# Patient Record
Sex: Male | Born: 1985 | Race: White | Hispanic: No | Marital: Married | Smoking: Never smoker
Health system: Southern US, Community
[De-identification: ages and names within clinical notes are randomized; demographics above are authoritative.]

## PROBLEM LIST (undated history)

## (undated) DIAGNOSIS — K589 Irritable bowel syndrome without diarrhea: Secondary | ICD-10-CM

## (undated) DIAGNOSIS — Z8619 Personal history of other infectious and parasitic diseases: Secondary | ICD-10-CM

## (undated) DIAGNOSIS — L02419 Cutaneous abscess of limb, unspecified: Secondary | ICD-10-CM

## (undated) DIAGNOSIS — S86011A Strain of right Achilles tendon, initial encounter: Secondary | ICD-10-CM

## (undated) DIAGNOSIS — M009 Pyogenic arthritis, unspecified: Secondary | ICD-10-CM

## (undated) DIAGNOSIS — J342 Deviated nasal septum: Secondary | ICD-10-CM

## (undated) DIAGNOSIS — K219 Gastro-esophageal reflux disease without esophagitis: Secondary | ICD-10-CM

## (undated) HISTORY — DX: Cutaneous abscess of limb, unspecified: L02.419

## (undated) HISTORY — PX: ACHILLES TENDON REPAIR: SUR1153

## (undated) HISTORY — DX: Personal history of other infectious and parasitic diseases: Z86.19

## (undated) HISTORY — DX: Pyogenic arthritis, unspecified: M00.9

## (undated) HISTORY — DX: Irritable bowel syndrome, unspecified: K58.9

## (undated) HISTORY — PX: WISDOM TOOTH EXTRACTION: SHX21

## (undated) HISTORY — DX: Strain of right Achilles tendon, initial encounter: S86.011A

## (undated) HISTORY — PX: TONSILLECTOMY: SUR1361

## (undated) HISTORY — DX: Gastro-esophageal reflux disease without esophagitis: K21.9

## (undated) HISTORY — DX: Deviated nasal septum: J34.2

---
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
## 2017-08-13 ENCOUNTER — Other Ambulatory Visit: Payer: Self-pay | Admitting: Family Medicine

## 2017-08-13 DIAGNOSIS — R599 Enlarged lymph nodes, unspecified: Secondary | ICD-10-CM

## 2017-08-13 DIAGNOSIS — R591 Generalized enlarged lymph nodes: Secondary | ICD-10-CM

## 2017-08-19 ENCOUNTER — Other Ambulatory Visit: Payer: Self-pay

## 2017-08-22 ENCOUNTER — Ambulatory Visit
Admission: RE | Admit: 2017-08-22 | Discharge: 2017-08-22 | Disposition: A | Discharge status: Home / Self Care | Payer: BLUE CROSS/BLUE SHIELD | Source: Ambulatory Visit | Attending: Family Medicine | Admitting: Family Medicine

## 2017-08-22 DIAGNOSIS — R591 Generalized enlarged lymph nodes: Secondary | ICD-10-CM

## 2017-08-22 DIAGNOSIS — R599 Enlarged lymph nodes, unspecified: Secondary | ICD-10-CM

## 2017-08-22 MED ORDER — IOPAMIDOL (ISOVUE-300) INJECTION 61%
75.0000 mL | Freq: Once | INTRAVENOUS | Status: AC | PRN
  Administered 2017-08-22: 75 mL via INTRAVENOUS

## 2018-04-28 IMAGING — CT CT NECK W/ CM
3 of 5 series · 9 of 20 positions shown, 10 images · IV contrast (75CC ISOVUE 300)
Comparison: Chest CTA 02/09/2017.  Thyroid ultrasound 12/09/2014.

CLINICAL DATA: 31-year-old male with bilateral intermittent
lymphadenopathy. Diagnosed with abstain bar virus in [REDACTED].
Thyroid region pain for 18 months.

EXAM:
CT NECK WITH CONTRAST
TECHNIQUE: Multidetector CT imaging of the neck was performed using the
standard protocol following the bolus administration of intravenous
contrast.
CONTRAST:  75mL QRMZ3I-1MM IOPAMIDOL (QRMZ3I-1MM) INJECTION 61%

[Series 3: axial neck · axial · 0.49mm/px · z∈[-232,-145]mm · 2 of 105 slices shown]
[im 35/105  bone]
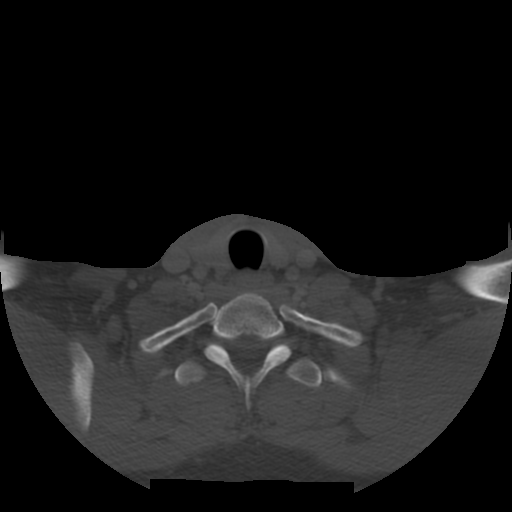
[im 70/105  bone]
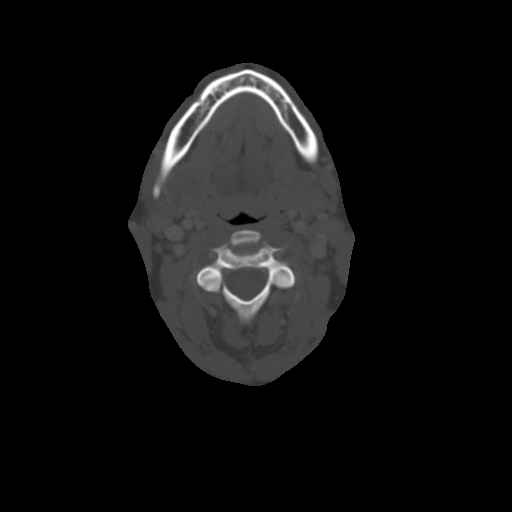

[Series 200: cor · coronal · 0.52mm/px · 3 of 97 slices shown]
[im 24/97  bone]
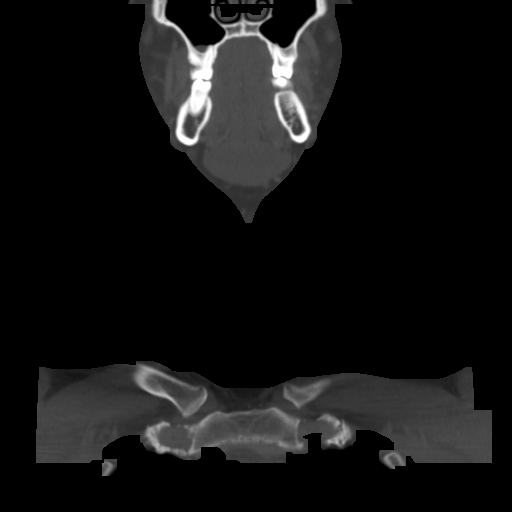
[im 40/97  bone]
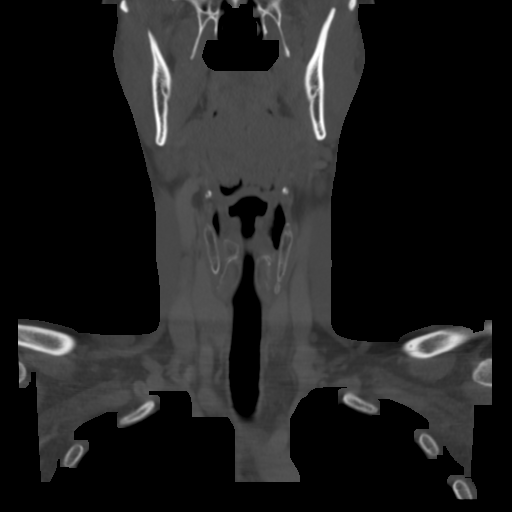
[im 57/97  bone]
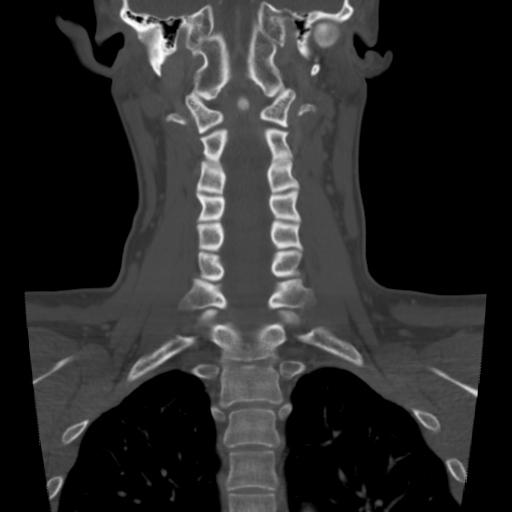

[Series 202: axial · axial · 0.52mm/px · z∈[-308,-153]mm · 4 of 135 slices shown, 5 images]
[im 27/135  soft-tissue]
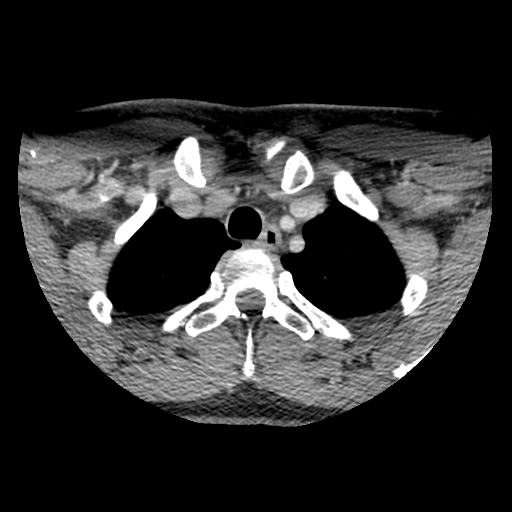
[im 27/135  bone]
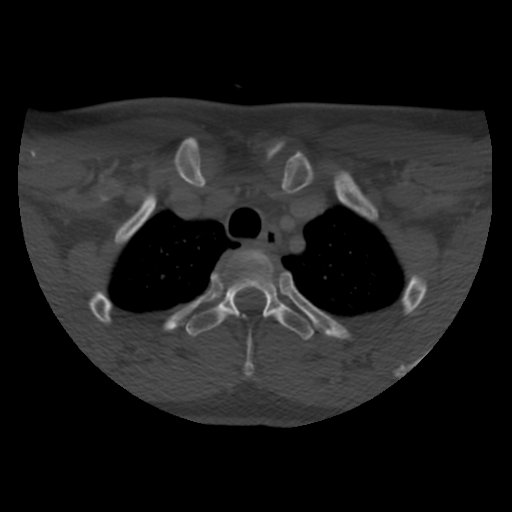
[im 54/135  bone]
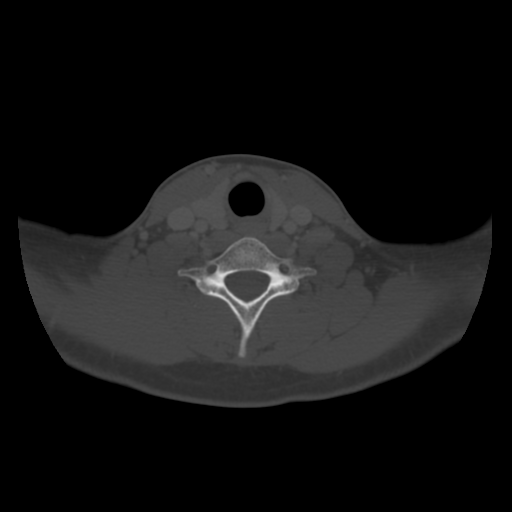
[im 81/135  bone]
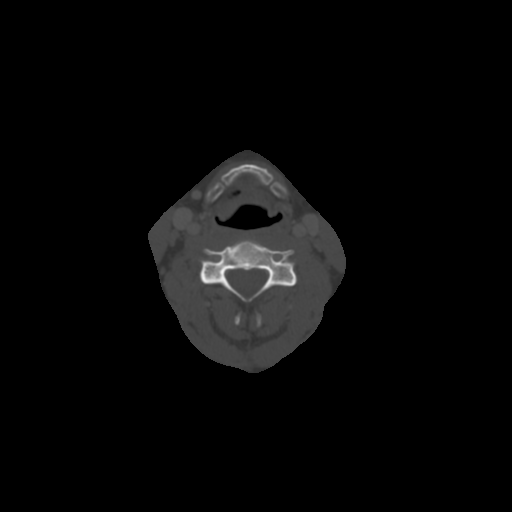
[im 108/135  bone]
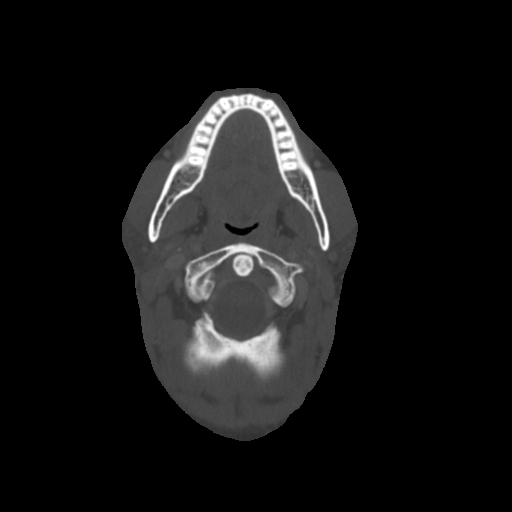

[9 of 20 positions shown; findings below may reference images not displayed]

FINDINGS: Pharynx and larynx: Laryngeal soft tissue contours are within normal
limits. There is mild hypertrophy of the lingual tonsil. The
palatine tonsils and adenoids may be surgically absent. Other
pharyngeal soft tissue contours are within normal limits. Negative
parapharyngeal and retropharyngeal spaces.

Salivary glands: Negative sublingual space. Bilateral submandibular
glands appear symmetric and within normal limits. Bilateral parotid
glands appear normal.

Thyroid: The thyroid appears stable to that visible on the prior
chest CTA and normal.

Lymph nodes: There are maximal level 2A lymph nodes measuring up to
10 mm 10-8700 mm short axis bilaterally. The left level 2 nodes are
slightly larger than the right. But no cystic or necrotic nodes are
identified. The remaining bilateral cervical lymph node stations are
within normal limits.

Vascular: Major vascular structures in the neck and at the skullbase
are patent and appear normal.

Limited intracranial: Negative.

Visualized orbits: Negative.

Mastoids and visualized paranasal sinuses: Clear.

Skeleton: No osseous abnormality identified.

Upper chest: The visualized superior mediastinum is normal. Negative
visible bilateral lung parenchyma.
IMPRESSION: 1. Cervical lymph nodes overall are within normal limits;
Maximal bilateral level 2 nodes demonstrate no heterogeneity or
suspicious features and are probably physiologic. Clinical
surveillance recommended, with repeat neck CT if enlargement is
detected.
2. Mild lingual tonsil hypertrophy is also likely physiologic as it
appears the palatine tonsils and adenoids are surgically absent.
3. Otherwise normal neck CT.

## 2018-07-24 ENCOUNTER — Encounter (INDEPENDENT_AMBULATORY_CARE_PROVIDER_SITE_OTHER): Payer: Self-pay

## 2018-09-18 ENCOUNTER — Ambulatory Visit: Payer: BLUE CROSS/BLUE SHIELD | Admitting: Family Medicine

## 2018-09-18 ENCOUNTER — Encounter: Payer: Self-pay | Admitting: Family Medicine

## 2018-09-18 DIAGNOSIS — Z0000 Encounter for general adult medical examination without abnormal findings: Secondary | ICD-10-CM | POA: Diagnosis not present

## 2018-09-18 NOTE — Patient Instructions (Signed)
-Great to meet you! -Drop off lab tests at your convenience or upload through [facility].  -F/u as needed  Adult Wellness Guidelines   Adult Health - for Ages 80 and Over Preventive care is very important for adults. By making some good basic health choices, women and men can boost their own health and well-being. Some of these positive choices include:   Eat a healthy diet  Get regular exercise  Don't use tobacco products  Limit alcohol use  Strive for a healthy weight  Adult Recommendations Screenings Physical Exam Every year, or as directed by your doctor. Body Mass Index (BMI) Every year. Blood Pressure (BP) At least every two years. Colon Cancer Screening Beginning at age 43 - colonoscopy every 10 years, or flexible sigmoidoscopy every five years or fecal blood test annually. Diabetes Screening Those with high blood pressure or high cholesterol should be screened. Others, especially those who are overweight or have a close family history of diabetes, should consider being screened every three years. Vision Screening Every year.  Immunizations Tetanus, Diphtheria, Pertussis (Td/ Tdap) Get Tdap vaccine once, then a Td booster every 10 years. Influenza (Flu) Every year. Herpes Zoster (Shingles) One dose given at age 63 and over. Varicella (Chicken Pox) Two doses if no evidence of immunity. Pneumococcal (Pneumonia) One or two doses for adults age 45 and older, or one or two doses depending on indication. Measles, Mumps, Rubella (MMR) One or two doses for adults ages 2-55 if no evidence of immunity. Human Papillomavirus (HPV) Three doses for women ages 19-26 if not already given. Three doses for men ages 19-21 if not already given.* Hepatitis A Two or three doses for adults age 34 and over.** Hepatitis B Three doses for ages 78 and over.** * Recommendations may vary. Discuss the start and frequency of screenings with your doctor, especially if you are at increased risk. **  For select populations. Discuss with your doctor if this vaccine is right for you.  Women's Health Women have their own unique health care needs. To stay well, they should make regular screenings a priority. Women should discuss the recommendations listed on the chart with their doctors. Women's Recommendations Mammogram Every year for women beginning at age 7.* Cholesterol Starting age and frequency of screenings are based on your individual risk factors. Talk with your doctor about what is best for you. Pap Test Women ages 21-65: Pap test every three years. Another option for ages 17-65: Pap test and HPV test every five years. Women who have had a hysterectomy or are over age 29 may not need a Pap test.* Osteoporosis Screening Beginning at age 41, or at age 2 if risk factors are present.* Aspirin Use At ages 66-79, talk with your doctor about the benefits and risks of aspirin use. Pelvic Exam Every year for ages 10 and over. Folic Acid Women planning/capable of pregnancy should take a daily supplement containing .4-.8 mg of folic acid for prevention of neural tube defects.  * Recommendations may vary. Discuss screening options with your doctor, especially if you are at increased risk. Sources: [place] Department of [employer] and Financial controller and the Centers for Disease Control and Prevention, U.S. Preventive Services Task Force   Men's Health Recommendations Men are encouraged to get care as needed and make smart choices. That includes following a healthy lifestyle and getting recommended preventive care services.  Recommended preventative care services are as follows:  Cholesterol Ages 20-35 should be tested if at high risk.  Men age 41 and over should be tested. Prostate Cancer Screening Ages 71 and over, discuss the benefits and risks of screening with your doctor.* Abdominal Aortic Aneurysm Once between ages 86 and 34 if you have ever smoked.

## 2018-09-18 NOTE — Assessment & Plan Note (Signed)
-  Well adult -immunizations: Up to date -Screenings:  None indicated at this time, had labs completed for insurance policy recently -Anticipatory guidance/Risk factor reduction:  Per AVS

## 2018-09-18 NOTE — Progress Notes (Signed)
Willie Maynard - 33 y.o. male MRN [id number]  Date of birth: 08/26/1986  Subjective Chief Complaint  Patient presents with  . Establish Care    denies changes in heatlth or concerns    HPI Willie Maynard is a 33 y.o. male here today to establish care and for annual exam.  He is also followed by integrative medicine specialist and has been treated for chronic EBV with acyclovir.  He is taking several supplements in addition to intermittent acyclovir.  He reports that symptoms of fatigue and blisters in throat have improved significantly since starting treatment.  He reports having labs completed recently for insurance policy and he will bring these in.  He has no new concerns today.  He works as a Education officer, community and follows a fairly healthy lifestyle.  He is a non-smoker.   Review of Systems  Constitutional: Negative for chills, fever, malaise/fatigue and weight loss.  HENT: Negative for congestion, ear pain and sore throat.   Eyes: Negative for blurred vision, double vision and pain.  Respiratory: Negative for cough and shortness of breath.   Cardiovascular: Negative for chest pain and palpitations.  Gastrointestinal: Negative for abdominal pain, blood in stool, constipation, heartburn and nausea.  Genitourinary: Negative for dysuria and urgency.  Musculoskeletal: Negative for joint pain and myalgias.  Neurological: Negative for dizziness and headaches.  Endo/Heme/Allergies: Does not bruise/bleed easily.  Psychiatric/Behavioral: Negative for depression. The patient is not nervous/anxious and does not have insomnia.     Allergies  Allergen Reactions  . Sulfa Antibiotics Hives, Other (See Comments) and Rash    History reviewed. No pertinent past medical history.  History reviewed. No pertinent surgical history.  Social History   Socioeconomic History  . Marital status: Married    Spouse name: Not on file  . Number of children: Not on file  . Years of education: Not on file    . Highest education level: Not on file  Occupational History  . Not on file  Social Needs  . Financial resource strain: Not on file  . Food insecurity:    Worry: Not on file    Inability: Not on file  . Transportation needs:    Medical: Not on file    Non-medical: Not on file  Tobacco Use  . Smoking status: Never Smoker  . Smokeless tobacco: Never Used  Substance and Sexual Activity  . Alcohol use: Yes  . Drug use: Not on file  . Sexual activity: Not on file  Lifestyle  . Physical activity:    Days per week: Not on file    Minutes per session: Not on file  . Stress: Not on file  Relationships  . Social connections:    Talks on phone: Not on file    Gets together: Not on file    Attends religious service: Not on file    Active member of club or organization: Not on file    Attends meetings of clubs or organizations: Not on file    Relationship status: Not on file  Other Topics Concern  . Not on file  Social History Narrative  . Not on file    History reviewed. No pertinent family history.  Health Maintenance  Topic Date Due  . HIV Screening  08/08/2000  . TETANUS/TDAP  08/08/2004  . INFLUENZA VACCINE  12/09/2018 (Originally 04/10/2018)    ----------------------------------------------------------------------------------------------------------------------------------------------------------------------------------------------------------------- Physical Exam BP 122/88   Pulse 67   Temp 98 F (36.7 C)   Ht 5\' 8"  (1.727 m)  Wt 186 lb (84.4 kg)   SpO2 97%   BMI 28.28 kg/m   Physical Exam Constitutional:      General: He is not in acute distress. HENT:     Head: Normocephalic and atraumatic.     Right Ear: External ear normal.     Left Ear: External ear normal.  Eyes:     General: No scleral icterus. Neck:     Musculoskeletal: Normal range of motion.     Thyroid: No thyromegaly.  Cardiovascular:     Rate and Rhythm: Normal rate and regular rhythm.      Heart sounds: Normal heart sounds.  Pulmonary:     Effort: Pulmonary effort is normal.     Breath sounds: Normal breath sounds.  Abdominal:     General: Bowel sounds are normal. There is no distension.     Palpations: Abdomen is soft.     Tenderness: There is no abdominal tenderness. There is no guarding.  Lymphadenopathy:     Cervical: No cervical adenopathy.  Skin:    General: Skin is warm and dry.     Findings: No rash.  Neurological:     General: No focal deficit present.     Mental Status: He is alert and oriented to person, place, and time.     Cranial Nerves: No cranial nerve deficit.     Motor: No abnormal muscle tone.  Psychiatric:        Mood and Affect: Mood normal.        Behavior: Behavior normal.     ------------------------------------------------------------------------------------------------------------------------------------------------------------------------------------------------------------------- Assessment and Plan  Well adult exam -Well adult -immunizations: Up to date -Screenings:  None indicated at this time, had labs completed for insurance policy recently -Anticipatory guidance/Risk factor reduction:  Per AVS

## 2019-10-23 ENCOUNTER — Ambulatory Visit: Payer: Self-pay | Attending: Internal Medicine

## 2019-10-23 DIAGNOSIS — Z23 Encounter for immunization: Secondary | ICD-10-CM | POA: Insufficient documentation

## 2019-10-23 NOTE — Progress Notes (Signed)
   Covid-19 Vaccination Clinic  Name:  Willie Maynard    MRN: [id number] DOB: 1985-12-18  10/23/2019  Mr. Mcentee was observed post Covid-19 immunization for 15 minutes without incidence. He was provided with Vaccine Information Sheet and instruction to access the V-Safe system.   Mr. Blanford was instructed to call 911 with any severe reactions post vaccine: Marland Kitchen Difficulty breathing  . Swelling of your face and throat  . A fast heartbeat  . A bad rash all over your body  . Dizziness and weakness    Immunizations Administered    Name Date Dose VIS Date Route   Pfizer COVID-19 Vaccine 10/23/2019 12:23 PM 0.3 mL 08/21/2019 Intramuscular   Manufacturer: [employer], [employer]   Lot: [id number]   NDC: [id number]

## 2019-11-14 ENCOUNTER — Ambulatory Visit: Payer: Self-pay | Attending: Internal Medicine

## 2019-11-14 DIAGNOSIS — Z23 Encounter for immunization: Secondary | ICD-10-CM | POA: Insufficient documentation

## 2019-11-14 NOTE — Progress Notes (Signed)
   Covid-19 Vaccination Clinic  Name:  JAVARION DOUTY    MRN: [id number] DOB: August 29, 1986  11/14/2019  Mr. Schroepfer was observed post Covid-19 immunization for 15 minutes without incident. He was provided with Vaccine Information Sheet and instruction to access the V-Safe system.   Mr. Tolen was instructed to call 911 with any severe reactions post vaccine: Marland Kitchen Difficulty breathing  . Swelling of face and throat  . A fast heartbeat  . A bad rash all over body  . Dizziness and weakness   Immunizations Administered    Name Date Dose VIS Date Route   Pfizer COVID-19 Vaccine 11/14/2019  3:30 PM 0.3 mL 08/21/2019 Intramuscular   Manufacturer: [employer], [employer]   Lot: [id number]   NDC: [address]-0

## 2020-03-10 ENCOUNTER — Ambulatory Visit (INDEPENDENT_AMBULATORY_CARE_PROVIDER_SITE_OTHER): Payer: No Typology Code available for payment source | Admitting: Family Medicine

## 2020-03-10 ENCOUNTER — Encounter: Payer: Self-pay | Admitting: Family Medicine

## 2020-03-10 ENCOUNTER — Other Ambulatory Visit: Payer: Self-pay

## 2020-03-10 VITALS — BP 122/70 | HR 75 | Temp 97.3°F | Ht 71.0 in | Wt 193.8 lb

## 2020-03-10 DIAGNOSIS — Z0000 Encounter for general adult medical examination without abnormal findings: Secondary | ICD-10-CM

## 2020-03-10 DIAGNOSIS — R195 Other fecal abnormalities: Secondary | ICD-10-CM | POA: Diagnosis not present

## 2020-03-10 DIAGNOSIS — L290 Pruritus ani: Secondary | ICD-10-CM | POA: Diagnosis not present

## 2020-03-10 DIAGNOSIS — K589 Irritable bowel syndrome without diarrhea: Secondary | ICD-10-CM | POA: Insufficient documentation

## 2020-03-10 NOTE — Patient Instructions (Signed)
Health Maintenance, Male Adopting a healthy lifestyle and getting preventive care are important in promoting health and wellness. Ask your health care provider about:  The right schedule for you to have regular tests and exams.  Things you can do on your own to prevent diseases and keep yourself healthy. What should I know about diet, weight, and exercise? Eat a healthy diet   Eat a diet that includes plenty of vegetables, fruits, low-fat dairy products, and lean protein.  Do not eat a lot of foods that are high in solid fats, added sugars, or sodium. Maintain a healthy weight Body mass index (BMI) is a measurement that can be used to identify possible weight problems. It estimates body fat based on height and weight. Your health care provider can help determine your BMI and help you achieve or maintain a healthy weight. Get regular exercise Get regular exercise. This is one of the most important things you can do for your health. Most adults should:  Exercise for at least 150 minutes each week. The exercise should increase your heart rate and make you sweat (moderate-intensity exercise).  Do strengthening exercises at least twice a week. This is in addition to the moderate-intensity exercise.  Spend less time sitting. Even light physical activity can be beneficial. Watch cholesterol and blood lipids Have your blood tested for lipids and cholesterol at 35 years of age, then have this test every 5 years. You may need to have your cholesterol levels checked more often if:  Your lipid or cholesterol levels are high.  You are older than 35 years of age.  You are at high risk for heart disease. What should I know about cancer screening? Many types of cancers can be detected early and may often be prevented. Depending on your health history and family history, you may need to have cancer screening at various ages. This may include screening for:  Colorectal cancer.  Prostate  cancer.  Skin cancer.  Lung cancer. What should I know about heart disease, diabetes, and high blood pressure? Blood pressure and heart disease  High blood pressure causes heart disease and increases the risk of stroke. This is more likely to develop in people who have high blood pressure readings, are of African descent, or are overweight.  Talk with your health care provider about your target blood pressure readings.  Have your blood pressure checked: ? Every 3-5 years if you are 18-39 years of age. ? Every year if you are 40 years old or older.  If you are between the ages of 65 and 75 and are a current or former smoker, ask your health care provider if you should have a one-time screening for abdominal aortic aneurysm (AAA). Diabetes Have regular diabetes screenings. This checks your fasting blood sugar level. Have the screening done:  Once every three years after age 45 if you are at a normal weight and have a low risk for diabetes.  More often and at a younger age if you are overweight or have a high risk for diabetes. What should I know about preventing infection? Hepatitis B If you have a higher risk for hepatitis B, you should be screened for this virus. Talk with your health care provider to find out if you are at risk for hepatitis B infection. Hepatitis C Blood testing is recommended for:  Everyone born from 1945 through 1965.  Anyone with known risk factors for hepatitis C. Sexually transmitted infections (STIs)  You should be screened each year   for STIs, including gonorrhea and chlamydia, if: ? You are sexually active and are younger than 35 years of age. ? You are older than 35 years of age and your health care provider tells you that you are at risk for this type of infection. ? Your sexual activity has changed since you were last screened, and you are at increased risk for chlamydia or gonorrhea. Ask your health care provider if you are at risk.  Ask your  health care provider about whether you are at high risk for HIV. Your health care provider may recommend a prescription medicine to help prevent HIV infection. If you choose to take medicine to prevent HIV, you should first get tested for HIV. You should then be tested every 3 months for as long as you are taking the medicine. Follow these instructions at home: Lifestyle  Do not use any products that contain nicotine or tobacco, such as cigarettes, e-cigarettes, and chewing tobacco. If you need help quitting, ask your health care provider.  Do not use street drugs.  Do not share needles.  Ask your health care provider for help if you need support or information about quitting drugs. Alcohol use  Do not drink alcohol if your health care provider tells you not to drink.  If you drink alcohol: ? Limit how much you have to 0-2 drinks a day. ? Be aware of how much alcohol is in your drink. In the [place] one drink equals one 12 oz bottle of beer (355 mL), one 5 oz glass of wine (148 mL), or one 1 oz glass of hard liquor (44 mL). General instructions  Schedule regular health, dental, and eye exams.  Stay current with your vaccines.  Tell your health care provider if: ? You often feel depressed. ? You have ever been abused or do not feel safe at home. Summary  Adopting a healthy lifestyle and getting preventive care are important in promoting health and wellness.  Follow your health care provider's instructions about healthy diet, exercising, and getting tested or screened for diseases.  Follow your health care provider's instructions on monitoring your cholesterol and blood pressure. This information is not intended to replace advice given to you by your health care provider. Make sure you discuss any questions you have with your health care provider. Document Revised: 08/20/2018 Document Reviewed: 08/20/2018 Elsevier Patient Education  2020 [employer]  Preventive Care 21-39 Years  Old, Male Preventive care refers to lifestyle choices and visits with your health care provider that can promote health and wellness. This includes:  A yearly physical exam. This is also called an annual well check.  Regular dental and eye exams.  Immunizations.  Screening for certain conditions.  Healthy lifestyle choices, such as eating a healthy diet, getting regular exercise, not using drugs or products that contain nicotine and tobacco, and limiting alcohol use. What can I expect for my preventive care visit? Physical exam Your health care provider will check:  Height and weight. These may be used to calculate body mass index (BMI), which is a measurement that tells if you are at a healthy weight.  Heart rate and blood pressure.  Your skin for abnormal spots. Counseling Your health care provider may ask you questions about:  Alcohol, tobacco, and drug use.  Emotional well-being.  Home and relationship well-being.  Sexual activity.  Eating habits.  Work and work environment. What immunizations do I need?  Influenza (flu) vaccine  This is recommended every year. Tetanus, diphtheria,   and pertussis (Tdap) vaccine  You may need a Td booster every 10 years. Varicella (chickenpox) vaccine  You may need this vaccine if you have not already been vaccinated. Human papillomavirus (HPV) vaccine  If recommended by your health care provider, you may need three doses over 6 months. Measles, mumps, and rubella (MMR) vaccine  You may need at least one dose of MMR. You may also need a second dose. Meningococcal conjugate (MenACWY) vaccine  One dose is recommended if you are 73-28 years old and a Market researcher living in a residence hall, or if you have one of several medical conditions. You may also need additional booster doses. Pneumococcal conjugate (PCV13) vaccine  You may need this if you have certain conditions and were not previously  vaccinated. Pneumococcal polysaccharide (PPSV23) vaccine  You may need one or two doses if you smoke cigarettes or if you have certain conditions. Hepatitis A vaccine  You may need this if you have certain conditions or if you travel or work in places where you may be exposed to hepatitis A. Hepatitis B vaccine  You may need this if you have certain conditions or if you travel or work in places where you may be exposed to hepatitis B. Haemophilus influenzae type b (Hib) vaccine  You may need this if you have certain risk factors. You may receive vaccines as individual doses or as more than one vaccine together in one shot (combination vaccines). Talk with your health care provider about the risks and benefits of combination vaccines. What tests do I need? Blood tests  Lipid and cholesterol levels. These may be checked every 5 years starting at age 80.  Hepatitis C test.  Hepatitis B test. Screening   Diabetes screening. This is done by checking your blood sugar (glucose) after you have not eaten for a while (fasting).  Sexually transmitted disease (STD) testing. Talk with your health care provider about your test results, treatment options, and if necessary, the need for more tests. Follow these instructions at home: Eating and drinking   Eat a diet that includes fresh fruits and vegetables, whole grains, lean protein, and low-fat dairy products.  Take vitamin and mineral supplements as recommended by your health care provider.  Do not drink alcohol if your health care provider tells you not to drink.  If you drink alcohol: ? Limit how much you have to 0-2 drinks a day. ? Be aware of how much alcohol is in your drink. In the [place] one drink equals one 12 oz bottle of beer (355 mL), one 5 oz glass of wine (148 mL), or one 1 oz glass of hard liquor (44 mL). Lifestyle  Take daily care of your teeth and gums.  Stay active. Exercise for at least 30 minutes on 5 or more days  each week.  Do not use any products that contain nicotine or tobacco, such as cigarettes, e-cigarettes, and chewing tobacco. If you need help quitting, ask your health care provider.  If you are sexually active, practice safe sex. Use a condom or other form of protection to prevent STIs (sexually transmitted infections). What's next?  Go to your health care provider once a year for a well check visit.  Ask your health care provider how often you should have your eyes and teeth checked.  Stay up to date on all vaccines. This information is not intended to replace advice given to you by your health care provider. Make sure you discuss any questions you  have with your health care provider. Document Revised: 08/21/2018 Document Reviewed: 08/21/2018 Elsevier Patient Education  [place].  Anal Pruritus Anal pruritus is an itchy feeling in the anus and on the skin around the anus. This is common and can be caused by many things. It often occurs when the area becomes moist. Moisture may be due to sweating or to a small amount of stool (feces) that is left on the area because of poor personal cleaning. Some other causes include:  Things that can irritate your skin, such as: ? Perfumed soaps and sprays. ? Colored or scented toilet paper. ? Excessive washing.  Certain foods, such as caffeine, beer, and spicy foods.  Diarrhea or loose stool.  Skin disorders (psoriasis, eczema, or seborrhea).  Hemorrhoids, fissures, infections, and other anal diseases.  Other medical conditions, such as diabetes, thyroid problems, STIs (sexually transmitted infections), or some cancers. In many cases, the cause is not known. The itching usually goes away with treatment and home care. Scratching can cause further skin damage and make the problem worse. Follow these instructions at home: Skin care      Practice good hygiene. ? Clean the anal area gently with wet toilet paper or a wet washcloth  after every bowel movement and at bedtime. ? Avoid using soaps on the anal area. ? Dry the area thoroughly. Pat the area dry with toilet paper or a towel.  Do not scrub the anal area with anything, including toilet paper.  Do not scratch the itchy area. Scratching causes more damage and makes the itching worse.  Take sitz baths as told by your health care provider. ? A sitz bath is a warm water bath that only comes up to your hips and covers your buttocks. A sitz bath may be done at home in a bathtub or with a portable sitz bath that fits over the toilet. ? Pat the area dry with a soft cloth after each bath.  Use creams or ointments as told by your health care provider. Zinc oxide ointment or a moisture barrier cream can be applied several times a day to protect and heal the skin.  Do not use anything that irritates the skin, such as bubble baths, scented toilet paper, or genital deodorants. General instructions  Pay attention to any changes in your symptoms.  Take or apply over-the-counter and prescription medicines only as told by your health care provider.  Avoid overusing medicines that help you have a bowel movement (laxatives). These can cause you to have loose stools.  Talk with your health care provider about whether you should increase the fiber in your diet. This can help keep your stool normal if you have frequent loose stools.  Limit or avoid foods that may cause your symptoms. These may include: ? Spicy foods, such as salsa, jalapeo peppers, and spicy seasonings. ? Caffeine or beer. ? Milk products. ? Chocolate, nuts, citrus fruits, or tomatoes.  Wear cotton underwear and loose clothing.  Keep all follow-up visits as told by your health care provider. This is important. Contact a health care provider if:  Your itching does not improve in several days.  Your itching gets worse.  You have a fever.  You have redness, swelling, or pain in the anal area.  You have  fluid, blood, or pus coming from the anal area. Summary  Anal pruritus is an itchy feeling in the anus and the skin in the anal area. This can be caused by many things, such  as things that irritate your skin and certain medical conditions.  Take or apply over-the-counter and prescription medicines only as told by your health care provider.  Practice good hygiene as told by your health care provider.  Talk with your health care provider about fiber supplements. These are helpful in keeping your stool normal if you have frequent loose stools.  Contact a health care provider if your symptoms get worse or if you develop new symptoms. This information is not intended to replace advice given to you by your health care provider. Make sure you discuss any questions you have with your health care provider. Document Revised: 01/06/2018 Document Reviewed: 01/06/2018 Elsevier Patient Education  [place].  Irritable Bowel Syndrome, Adult  Irritable bowel syndrome (IBS) is a group of symptoms that affects the organs responsible for digestion (gastrointestinal or GI tract). IBS is not one specific disease. To regulate how the GI tract works, the body sends signals back and forth between the intestines and the brain. If you have IBS, there may be a problem with these signals. As a result, the GI tract does not function normally. The intestines may become more sensitive and overreact to certain things. This may be especially true when you eat certain foods or when you are under stress. There are four types of IBS. These may be determined based on the consistency of your stool (feces):  IBS with diarrhea.  IBS with constipation.  Mixed IBS.  Unsubtyped IBS. It is important to know which type of IBS you have. Certain treatments are more likely to be helpful for certain types of IBS. What are the causes? The exact cause of IBS is not known. What increases the risk? You may have a higher risk  for IBS if you:  Are male.  Are younger than 95.  Have a family history of IBS.  Have a mental health condition, such as depression, anxiety, or post-traumatic stress disorder.  Have had a bacterial infection of your GI tract. What are the signs or symptoms? Symptoms of IBS vary from person to person. The main symptom is abdominal pain or discomfort. Other symptoms usually include one or more of the following:  Diarrhea, constipation, or both.  Abdominal swelling or bloating.  Feeling full after eating a small or regular-sized meal.  Frequent gas.  Mucus in the stool.  A feeling of having more stool left after a bowel movement. Symptoms tend to come and go. They may be triggered by stress, mental health conditions, or certain foods. How is this diagnosed? This condition may be diagnosed based on a physical exam, your medical history, and your symptoms. You may have tests, such as:  Blood tests.  Stool test.  X-rays.  CT scan.  Colonoscopy. This is a procedure in which your GI tract is viewed with a long, thin, flexible tube. How is this treated? There is no cure for IBS, but treatment can help relieve symptoms. Treatment depends on the type of IBS you have, and may include:  Changes to your diet, such as: ? Avoiding foods that cause symptoms. ? Drinking more water. ? Following a low-FODMAP (fermentable oligosaccharides, disaccharides, monosaccharides, and polyols) diet for up to 6 weeks, or as told by your health care provider. FODMAPs are sugars that are hard for some people to digest. ? Eating more fiber. ? Eating medium-sized meals at the same times every day.  Medicines. These may include: ? Fiber supplements, if you have constipation. ? Medicine to control diarrhea (  antidiarrheal medicines). ? Medicine to help control muscle tightening (spasms) in your GI tract (antispasmodic medicines). ? Medicines to help with mental health conditions, such as  antidepressants or tranquilizers.  Talk therapy or counseling.  Working with a diet and nutrition specialist (dietitian) to help create a food plan that is right for you.  Managing your stress. Follow these instructions at home: Eating and drinking  Eat a healthy diet.  Eat medium-sized meals at about the same time every day. Do not eat large meals.  Gradually eat more fiber-rich foods. These include whole grains, fruits, and vegetables. This may be especially helpful if you have IBS with constipation.  Eat a diet low in FODMAPs.  Drink enough fluid to keep your urine pale yellow.  Keep a journal of foods that seem to trigger symptoms.  Avoid foods and drinks that: ? Contain added sugar. ? Make your symptoms worse. Dairy products, caffeinated drinks, and carbonated drinks can make symptoms worse for some people. General instructions  Take over-the-counter and prescription medicines and supplements only as told by your health care provider.  Get enough exercise. Do at least 150 minutes of moderate-intensity exercise each week.  Manage your stress. Getting enough sleep and exercise can help you manage stress.  Keep all follow-up visits as told by your health care provider and therapist. This is important. Alcohol Use  Do not drink alcohol if: ? Your health care provider tells you not to drink. ? You are pregnant, may be pregnant, or are planning to become pregnant.  If you drink alcohol, limit how much you have: ? 0-1 drink a day for women. ? 0-2 drinks a day for men.  Be aware of how much alcohol is in your drink. In the [place] one drink equals one typical bottle of beer (12 oz), one-half glass of wine (5 oz), or one shot of hard liquor (1 oz). Contact a health care provider if you have:  Constant pain.  Weight loss.  Difficulty or pain when swallowing.  Diarrhea that gets worse. Get help right away if you have:  Severe abdominal pain.  Fever.  Diarrhea with  symptoms of dehydration, such as dizziness or dry mouth.  Bright red blood in your stool.  Stool that is black and tarry.  Abdominal swelling.  Vomiting that does not stop.  Blood in your vomit. Summary  Irritable bowel syndrome (IBS) is not one specific disease. It is a group of symptoms that affects digestion.  Your intestines may become more sensitive and overreact to certain things. This may be especially true when you eat certain foods or when you are under stress.  There is no cure for IBS, but treatment can help relieve symptoms. This information is not intended to replace advice given to you by your health care provider. Make sure you discuss any questions you have with your health care provider. Document Revised: 08/20/2017 Document Reviewed: 08/20/2017 Elsevier Patient Education  2020 Else[employer] Inc.  Mindfulness-Based Stress Reduction Mindfulness-based stress reduction (MBSR) is a program that helps people learn to practice mindfulness. Mindfulness is the practice of intentionally paying attention to the present moment. It can be learned and practiced through techniques such as education, breathing exercises, meditation, and yoga. MBSR includes several mindfulness techniques in one program. MBSR works best when you understand the treatment, are willing to try new things, and can commit to spending time practicing what you learn. MBSR training may include learning about:  How your emotions, thoughts, and reactions affect your  body.  New ways to respond to things that cause negative thoughts to start (triggers).  How to notice your thoughts and let go of them.  Practicing awareness of everyday things that you normally do without thinking.  The techniques and goals of different types of meditation. What are the benefits of MBSR? MBSR can have many benefits, which include helping you to:  Develop self-awareness. This refers to knowing and understanding yourself.  Learn  skills and attitudes that help you to participate in your own health care.  Learn new ways to care for yourself.  Be more accepting about how things are, and let things go.  Be less judgmental and approach things with an open mind.  Be patient with yourself and trust yourself more. MBSR has also been shown to:  Reduce negative emotions, such as depression and anxiety.  Improve memory and focus.  Change how you sense and approach pain.  Boost your body's ability to fight infections.  Help you connect better with other people.  Improve your sense of well-being. Follow these instructions at home:   Find a local in-person or online MBSR program.  Set aside some time regularly for mindfulness practice.  Find a mindfulness practice that works best for you. This may include one or more of the following: ? Meditation. Meditation involves focusing your mind on a certain thought or activity. ? Breathing awareness exercises. These help you to stay present by focusing on your breath. ? Body scan. For this practice, you lie down and pay attention to each part of your body from head to toe. You can identify tension and soreness and intentionally relax parts of your body. ? Yoga. Yoga involves stretching and breathing, and it can improve your ability to move and be flexible. It can also provide an experience of testing your body's limits, which can help you release stress. ? Mindful eating. This way of eating involves focusing on the taste, texture, color, and smell of each bite of food. Because this slows down eating and helps you feel full sooner, it can be an important part of a weight-loss plan.  Find a podcast or recording that provides guidance for breathing awareness, body scan, or meditation exercises. You can listen to these any time when you have a free moment to rest without distractions.  Follow your treatment plan as told by your health care provider. This may include taking  regular medicines and making changes to your diet or lifestyle as recommended. How to practice mindfulness To do a basic awareness exercise:  Find a comfortable place to sit.  Pay attention to the present moment. Observe your thoughts, feelings, and surroundings just as they are.  Avoid placing judgment on yourself, your feelings, or your surroundings. Make note of any judgment that comes up, and let it go.  Your mind may wander, and that is okay. Make note of when your thoughts drift, and return your attention to the present moment. To do basic mindfulness meditation:  Find a comfortable place to sit. This may include a stable chair or a firm floor cushion. ? Sit upright with your back straight. Let your arms fall next to your side with your hands resting on your legs. ? If sitting in a chair, rest your feet flat on the floor. ? If sitting on a cushion, cross your legs in front of you.  Keep your head in a neutral position with your chin dropped slightly. Relax your jaw and rest the tip of your  tongue on the roof of your mouth. Drop your gaze to the floor. You can close your eyes if you like.  Breathe normally and pay attention to your breath. Feel the air moving in and out of your nose. Feel your belly expanding and relaxing with each breath.  Your mind may wander, and that is okay. Make note of when your thoughts drift, and return your attention to your breath.  Avoid placing judgment on yourself, your feelings, or your surroundings. Make note of any judgment or feelings that come up, let them go, and bring your attention back to your breath.  When you are ready, lift your gaze or open your eyes. Pay attention to how your body feels after the meditation. Where to find more information You can find more information about MBSR from:  Your health care provider.  Community-based meditation centers or programs.  Programs offered near you. Summary  Mindfulness-based stress reduction  (MBSR) is a program that teaches you how to intentionally pay attention to the present moment. It is used with other treatments to help you cope better with daily stress, emotions, and pain.  MBSR focuses on developing self-awareness, which allows you to respond to life stress without judgment or negative emotions.  MBSR programs may involve learning different mindfulness practices, such as breathing exercises, meditation, yoga, body scan, or mindful eating. Find a mindfulness practice that works best for you, and set aside time for it on a regular basis. This information is not intended to replace advice given to you by your health care provider. Make sure you discuss any questions you have with your health care provider. Document Revised: 08/09/2017 Document Reviewed: 01/03/2017 Elsevier Patient Education  [place].

## 2020-03-10 NOTE — Progress Notes (Signed)
Established Patient Office Visit  Subjective:  Patient ID: Willie Maynard, male    DOB: 1986/06/19  Age: 34 y.o. MRN: [id number]  CC:  Chief Complaint  Patient presents with  . Transitions Of Care    toc from Dr. Zigmund Axcel,     HPI Willie Maynard presents for a physical exam.  For the last 2 to 3 months he has noticed some changes in his stooling patterns.  Stools have been stringy.  There has been some crampy abdominal pain.  He has had itching in the anal area.  Sometimes his bowel movements are painful.  He denies weight loss or blood in his stools or in the toilet bowl.  Denies constipation or diarrhea.  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  He has no history of perirectal abscess.  Extended family history of colon cancer.  No history of colon cancer in either parent.  He is a Careers information officer.  He lives with his wife and 2 small children.  He is a healthy eater.  Exercises at least 3 times weekly.  Does not smoke use illicit drugs.  No more than 2 alcoholic drinks in a given setting.  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  Practitioners at [facility] treat him with Valtrex and naltrexone.  He is 3 hours fasting.  Has noted tinnitus in the right ear.  Denies headache, hearing loss or a spinning sensation.  History reviewed. No pertinent past medical history.  History reviewed. No pertinent surgical history.  History reviewed. No pertinent family history.  Social History   Socioeconomic History  . Marital status: Married    Spouse name: Not on file  . Number of children: Not on file  . Years of education: Not on file  . Highest education level: Not on file  Occupational History  . Not on file  Tobacco Use  . Smoking status: Never Smoker  . Smokeless tobacco: Never Used  Substance and Sexual Activity  . Alcohol use: Yes  . Drug use: Not on file  . Sexual  activity: Not on file  Other Topics Concern  . Not on file  Social History Narrative  . Not on file   Social Determinants of Health   Financial Resource Strain:   . Difficulty of Paying Living Expenses:   Food Insecurity:   . Worried About Charity fundraiser in the Last Year:   . Arboriculturist in the Last Year:   Transportation Needs:   . Film/video editor (Medical):   Marland Kitchen Lack of Transportation (Non-Medical):   Physical Activity:   . Days of Exercise per Week:   . Minutes of Exercise per Session:   Stress:   . Feeling of Stress :   Social Connections:   . Frequency of Communication with Friends and Family:   . Frequency of Social Gatherings with Friends and Family:   . Attends Religious Services:   . Active Member of Clubs or Organizations:   . Attends Archivist Meetings:   Marland Kitchen Marital Status:   Intimate Partner Violence:   . Fear of Current or Ex-Partner:   . Emotionally Abused:   Marland Kitchen Physically Abused:   . Sexually Abused:     Outpatient Medications Prior to Visit  Medication Sig Dispense Refill  . valACYclovir (VALTREX) 1000 MG tablet Take 1,000 mg by mouth 3 (three) times daily.  No facility-administered medications prior to visit.    Allergies  Allergen Reactions  . Sulfa Antibiotics Hives, Other (See Comments) and Rash    ROS Review of Systems  Constitutional: Negative for diaphoresis, fatigue, fever and unexpected weight change.  HENT: Negative.  Negative for hearing loss.   Eyes: Negative for photophobia and visual disturbance.  Respiratory: Negative.   Cardiovascular: Negative.   Gastrointestinal: Negative.   Endocrine: Negative for polyphagia and polyuria.  Genitourinary: Negative.  Negative for difficulty urinating, frequency and urgency.  Musculoskeletal: Negative for gait problem and joint swelling.  Skin: Negative for pallor and rash.  Allergic/Immunologic: Negative for immunocompromised state.  Neurological: Negative for  dizziness, light-headedness and headaches.  Hematological: Does not bruise/bleed easily.  Psychiatric/Behavioral: Negative.    Depression screen [facility] 2/9 03/10/2020 09/18/2018  Decreased Interest 0 0  Down, Depressed, Hopeless 0 0  PHQ - 2 Score 0 0      Objective:    Physical Exam Vitals and nursing note reviewed.  Constitutional:      General: He is not in acute distress.    Appearance: Normal appearance. He is normal weight. He is not ill-appearing, toxic-appearing or diaphoretic.  HENT:     Head: Normocephalic and atraumatic.     Right Ear: Tympanic membrane, ear canal and external ear normal.     Left Ear: Tympanic membrane, ear canal and external ear normal.     Mouth/Throat:     Mouth: Mucous membranes are moist. No oral lesions.     Pharynx: Oropharynx is clear. No pharyngeal swelling, oropharyngeal exudate, posterior oropharyngeal erythema or uvula swelling.     Tonsils: No tonsillar exudate.  Eyes:     General: No scleral icterus.       Right eye: No discharge.        Left eye: No discharge.     Extraocular Movements: Extraocular movements intact.     Conjunctiva/sclera: Conjunctivae normal.     Pupils: Pupils are equal, round, and reactive to light.  Cardiovascular:     Rate and Rhythm: Normal rate and regular rhythm.     Pulses: Normal pulses.     Heart sounds: Normal heart sounds.  Pulmonary:     Effort: Pulmonary effort is normal.     Breath sounds: Normal breath sounds.  Abdominal:     General: Bowel sounds are normal. There is no distension.     Palpations: There is no mass.     Tenderness: There is no abdominal tenderness. There is no guarding or rebound.     Hernia: No hernia is present. There is no hernia in the left inguinal area or right inguinal area.  Genitourinary:    Penis: Circumcised. No hypospadias, erythema, tenderness, discharge, swelling or lesions.      Testes:        Right: Mass, tenderness or swelling not present. Right testis is descended.         Left: Mass, tenderness or swelling not present. Left testis is descended.     Epididymis:     Right: Not inflamed or enlarged.     Left: Not inflamed or enlarged.     Prostate: Not enlarged, not tender and no nodules present.     Rectum: Guaiac result positive. No mass, tenderness, anal fissure, external hemorrhoid or internal hemorrhoid. Normal anal tone.  Musculoskeletal:     Cervical back: Normal range of motion and neck supple. No rigidity.     Right lower leg: No edema.  Left lower leg: No edema.  Lymphadenopathy:     Cervical: No cervical adenopathy.     Lower Body: No right inguinal adenopathy. No left inguinal adenopathy.  Skin:    General: Skin is warm and dry.  Neurological:     Mental Status: He is alert and oriented to person, place, and time.  Psychiatric:        Mood and Affect: Mood normal.        Behavior: Behavior normal.     BP 122/70   Pulse 75   Temp (!) 97.3 F (36.3 C) (Tympanic)   Ht _0  (1.803 m)   Wt 193 lb 12.8 oz (87.9 kg)   SpO2 96%   BMI 27.03 kg/m  Wt Readings from Last 3 Encounters:  03/10/20 193 lb 12.8 oz (87.9 kg)  09/18/18 186 lb (84.4 kg)     Health Maintenance Due  Topic Date Due  . Hepatitis C Screening  Never done  . HIV Screening  Never done    There are no preventive care reminders to display for this patient.  No results found for: TSH No results found for: WBC, HGB, HCT, MCV, PLT No results found for: NA, K, CHLORIDE, CO2, GLUCOSE, BUN, CREATININE, BILITOT, ALKPHOS, AST, ALT, PROT, ALBUMIN, CALCIUM, ANIONGAP, EGFR, GFR No results found for: CHOL No results found for: HDL No results found for: LDLCALC No results found for: TRIG No results found for: CHOLHDL No results found for: HGBA1C    Assessment & Plan:   Problem List Items Addressed This Visit      Digestive   Irritable bowel syndrome   Relevant Orders   Ambulatory referral to Gastroenterology     Musculoskeletal and Integument   Pruritus  ani   Relevant Orders   Ambulatory referral to Gastroenterology     Other   Healthcare maintenance - Primary   Relevant Orders   Ambulatory referral to Gastroenterology   Comprehensive metabolic panel   CBC   Lipid panel   TSH   Urinalysis, Routine w reflex microscopic   HIV Antibody (routine testing w rflx)   Heme positive stool   Relevant Orders   Ambulatory referral to Gastroenterology      No orders of the defined types were placed in this encounter.   Follow-up: Return in about 1 year (around 03/10/2021), or if symptoms worsen or fail to improve.  Given information on health maintenance disease prevention, irritable bowel syndrome, pruritus a night and mindfulness based stress reduction.  Libby Maw, MD

## 2020-03-11 LAB — COMPREHENSIVE METABOLIC PANEL
ALT: 20 U/L (ref 0–53)
AST: 17 U/L (ref 0–37)
Albumin: 4.8 g/dL (ref 3.5–5.2)
Alkaline Phosphatase: 70 U/L (ref 39–117)
BUN: 14 mg/dL (ref 6–23)
CO2: 29 mEq/L (ref 19–32)
Calcium: 9.7 mg/dL (ref 8.4–10.5)
Chloride: 100 mEq/L (ref 96–112)
Creatinine, Ser: 1.03 mg/dL (ref 0.40–1.50)
GFR: 82.38 mL/min (ref >60.00)
Glucose, Bld: 97 mg/dL (ref 70–99)
Potassium: 4.1 mEq/L (ref 3.5–5.1)
Sodium: 137 mEq/L (ref 135–145)
Total Bilirubin: 0.6 mg/dL (ref 0.2–1.2)
Total Protein: 7.3 g/dL (ref 6.0–8.3)

## 2020-03-11 LAB — LIPID PANEL
Cholesterol: 201 mg/dL — ABNORMAL HIGH (ref 0–200)
HDL: 40 mg/dL (ref >39.00)
NonHDL: 161.45
Total CHOL/HDL Ratio: 5
Triglycerides: 291 mg/dL — ABNORMAL HIGH (ref 0.0–149.0)
VLDL: 58.2 mg/dL — ABNORMAL HIGH (ref 0.0–40.0)

## 2020-03-11 LAB — URINALYSIS, ROUTINE W REFLEX MICROSCOPIC
Bilirubin Urine: NEGATIVE
Hgb urine dipstick: NEGATIVE
Ketones, ur: NEGATIVE
Leukocytes,Ua: NEGATIVE
Nitrite: NEGATIVE
RBC / HPF: NONE SEEN (ref 0–?)
Specific Gravity, Urine: 1.01 (ref 1.000–1.030)
Total Protein, Urine: NEGATIVE
Urine Glucose: NEGATIVE
Urobilinogen, UA: 0.2 (ref 0.0–1.0)
WBC, UA: NONE SEEN (ref 0–?)
pH: 7 (ref 5.0–8.0)

## 2020-03-11 LAB — CBC
HCT: 42.8 % (ref 39.0–52.0)
Hemoglobin: 14.9 g/dL (ref 13.0–17.0)
MCHC: 34.7 g/dL (ref 30.0–36.0)
MCV: 96.9 fl (ref 78.0–100.0)
Platelets: 257 10*3/uL (ref 150.0–400.0)
RBC: 4.42 Mil/uL (ref 4.22–5.81)
RDW: 11.9 % (ref 11.5–15.5)
WBC: 6.1 10*3/uL (ref 4.0–10.5)

## 2020-03-11 LAB — HIV ANTIBODY (ROUTINE TESTING W REFLEX): HIV 1&2 Ab, 4th Generation: NONREACTIVE

## 2020-03-11 LAB — LDL CHOLESTEROL, DIRECT: Direct LDL: 125 mg/dL

## 2020-03-11 LAB — TSH: TSH: 1.95 u[IU]/mL (ref 0.35–4.50)

## 2020-03-29 ENCOUNTER — Encounter: Payer: Self-pay | Admitting: Family Medicine

## 2020-05-30 ENCOUNTER — Telehealth: Payer: Self-pay | Admitting: Family Medicine

## 2020-05-30 NOTE — Addendum Note (Signed)
Addended by: Lake Bells on: 05/30/2020 04:25 PM   Modules accepted: Orders

## 2020-05-30 NOTE — Telephone Encounter (Signed)
Called patient to inform that referral was put in no answer LMTCB

## 2020-05-30 NOTE — Telephone Encounter (Signed)
Patient called back and stated that Dr. Doreene Burke put in a referral to see a Gastro Specialist but referral has been closed due to not being able to reach the patient. Pt wanted to see if another referral be sent, please advise. CB is [phone]

## 2020-06-01 ENCOUNTER — Encounter: Payer: Self-pay | Admitting: Internal Medicine

## 2020-07-05 ENCOUNTER — Encounter: Payer: Self-pay | Admitting: *Deleted

## 2020-07-22 ENCOUNTER — Other Ambulatory Visit (INDEPENDENT_AMBULATORY_CARE_PROVIDER_SITE_OTHER): Payer: No Typology Code available for payment source

## 2020-07-22 ENCOUNTER — Ambulatory Visit: Payer: No Typology Code available for payment source | Admitting: Internal Medicine

## 2020-07-22 ENCOUNTER — Encounter: Payer: Self-pay | Admitting: Internal Medicine

## 2020-07-22 VITALS — BP 120/68 | HR 66 | Ht 72.0 in | Wt 198.0 lb

## 2020-07-22 DIAGNOSIS — R112 Nausea with vomiting, unspecified: Secondary | ICD-10-CM

## 2020-07-22 DIAGNOSIS — R194 Change in bowel habit: Secondary | ICD-10-CM

## 2020-07-22 DIAGNOSIS — R195 Other fecal abnormalities: Secondary | ICD-10-CM

## 2020-07-22 DIAGNOSIS — L290 Pruritus ani: Secondary | ICD-10-CM

## 2020-07-22 DIAGNOSIS — K6289 Other specified diseases of anus and rectum: Secondary | ICD-10-CM

## 2020-07-22 DIAGNOSIS — K219 Gastro-esophageal reflux disease without esophagitis: Secondary | ICD-10-CM

## 2020-07-22 DIAGNOSIS — R140 Abdominal distension (gaseous): Secondary | ICD-10-CM | POA: Diagnosis not present

## 2020-07-22 LAB — COMPREHENSIVE METABOLIC PANEL
ALT: 29 U/L (ref 0–53)
AST: 21 U/L (ref 0–37)
Albumin: 4.6 g/dL (ref 3.5–5.2)
Alkaline Phosphatase: 61 U/L (ref 39–117)
BUN: 13 mg/dL (ref 6–23)
CO2: 31 mEq/L (ref 19–32)
Calcium: 9.6 mg/dL (ref 8.4–10.5)
Chloride: 101 mEq/L (ref 96–112)
Creatinine, Ser: 1.08 mg/dL (ref 0.40–1.50)
GFR: 89.26 mL/min (ref >60.00)
Glucose, Bld: 93 mg/dL (ref 70–99)
Potassium: 4.5 mEq/L (ref 3.5–5.1)
Sodium: 137 mEq/L (ref 135–145)
Total Bilirubin: 1 mg/dL (ref 0.2–1.2)
Total Protein: 7.6 g/dL (ref 6.0–8.3)

## 2020-07-22 LAB — CBC WITH DIFFERENTIAL/PLATELET
Basophils Absolute: 0 10*3/uL (ref 0.0–0.1)
Basophils Relative: 0.7 % (ref 0.0–3.0)
Eosinophils Absolute: 0.1 10*3/uL (ref 0.0–0.7)
Eosinophils Relative: 2 % (ref 0.0–5.0)
HCT: 44 % (ref 39.0–52.0)
Hemoglobin: 15.5 g/dL (ref 13.0–17.0)
Lymphocytes Relative: 32.6 % (ref 12.0–46.0)
Lymphs Abs: 1.5 10*3/uL (ref 0.7–4.0)
MCHC: 35.2 g/dL (ref 30.0–36.0)
MCV: 94.9 fl (ref 78.0–100.0)
Monocytes Absolute: 0.5 10*3/uL (ref 0.1–1.0)
Monocytes Relative: 11.1 % (ref 3.0–12.0)
Neutro Abs: 2.4 10*3/uL (ref 1.4–7.7)
Neutrophils Relative %: 53.6 % (ref 43.0–77.0)
Platelets: 246 10*3/uL (ref 150.0–400.0)
RBC: 4.64 Mil/uL (ref 4.22–5.81)
RDW: 12.9 % (ref 11.5–15.5)
WBC: 4.5 10*3/uL (ref 4.0–10.5)

## 2020-07-22 LAB — HIGH SENSITIVITY CRP: CRP, High Sensitivity: 4.95 mg/L (ref 0.000–5.000)

## 2020-07-22 MED ORDER — SUTAB 1479-225-188 MG PO TABS: 1.0000 | ORAL_TABLET | Freq: Once | ORAL | 0 refills | Status: AC

## 2020-07-22 NOTE — Progress Notes (Signed)
Patient ID: Willie Maynard, DDS, male   DOB: 04-26-1986, 34 y.o.   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 Doreene Burke to evaluate perianal discomfort with itching, abdominal bloating, change in bowel habits and heme positive stool. He is here alone today.  He works as a Education officer, community in [place], [place]. He grew up in this area and did his dental training in [place]. He is married with 2 children ages 29 and 2. 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  He reports that he has had 6 to 8 months of troublesome GI symptoms including perianal soreness and itching. At times he has felt a nodule near the anal canal which she states is similar to symptoms in 2013 when he was diagnosed ultimately with perianal fistula which was treated surgically during an exam under anesthesia. He has noted that his bowel habits have changed in this time. Where they are thinner with more incomplete evacuation. He rarely will he see blood in his stool but this is nonpainful bleeding. He does have issues with abdominal bloating. He has an intermittent nausea and rarely will vomit. He has some heartburn symptoms at times as well as sour brash. He describes at other times his stomach is more "uneasy". This comes and goes. He will occasionally have regurgitation of food.  He has had issues with oral ulcers for many years and he sees [facility] and he has been diagnosed with intermittent recurrent EBV related oral ulcerations. These are treated with Valtrex. He also takes other supplements prescribed by [facility].  He reports in the past he was diagnosed with Giardia but treated by [facility] in a more naturopathic approach. He does intermittently take a "antiparasitic  vitamin" called para 1.  Family history notable for IBS in his brother. His brother recently had a normal colonoscopy. His brother is younger than he. His father has had colon polyps. His grandmother had colon cancer in her 70s.  Rectal exam was performed by primary care in early July 2021. There was no report of hemorrhoids, fissure. Stool on that day was noted to be heme positive by guaiac testing.  Past Medical History:  Diagnosis Date  . Ankle abscess   . Deviated nasal septum   . GERD (gastroesophageal reflux disease)   . History of Epstein-Barr virus infection   . Irritable bowel syndrome   . Pyogenic arthritis (HCC)   . Rupture of right Achilles tendon     Past Surgical History:  Procedure Laterality Date  . ACHILLES TENDON REPAIR Right    x 3  . TONSILLECTOMY    . WISDOM TOOTH EXTRACTION      Outpatient Medications Prior to Visit  Medication Sig Dispense Refill  . Folic Acid-Cholecalciferol (CHOLECAL DF PO) Take 1 tablet by mouth daily.    . Multiple Vitamin (MULTIVITAMIN) tablet Take 1 tablet by mouth daily.    . Probiotic Product (PRO-BIOTIC BLEND PO) Take 1 tablet by mouth daily.    . valACYclovir (VALTREX) 1000 MG tablet Take 1,000 mg by mouth as needed.      No facility-administered medications prior to visit.    Allergies  Allergen Reactions  . Sulfa Antibiotics Hives, Other (See Comments) and Rash    Family History  Problem Relation Age of Onset  . Hypertension Father   . Cancer Maternal Grandmother   . Colon cancer Maternal Grandmother        diagnosed in her 65's  . Cancer Paternal Grandfather     Social History   Tobacco Use  . Smoking status: Never Smoker  . Smokeless tobacco: Never Used  Substance Use Topics  . Alcohol use: Yes  . Drug use: Never    ROS: As per history of present illness, otherwise negative  BP 120/68   Pulse 66   Ht 6' (1.829 m)   Wt 198 lb (89.8 kg)   BMI 26.85 kg/m  Constitutional: Well-developed and  well-nourished. No distress. HEENT: Normocephalic and atraumatic.   No scleral icterus. Cardiovascular: Normal rate, regular rhythm and intact distal pulses. No M/R/G Pulmonary/chest: Effort normal and breath sounds normal. No wheezing, rales or rhonchi. Abdominal: Soft, nontender, nondistended. Bowel sounds active throughout. There are no masses palpable. No hepatosplenomegaly. Extremities: no clubbing, cyanosis, or edema Neurological: Alert and oriented to person place and time. Skin: Skin is warm and dry.  Psychiatric: Normal mood and affect. Behavior is normal.  RELEVANT LABS AND IMAGING: CBC    Component Value Date/Time   WBC 6.1 03/10/2020 1604   RBC 4.42 03/10/2020 1604   HGB 14.9 03/10/2020 1604   HCT 42.8 03/10/2020 1604   PLT 257.0 03/10/2020 1604   MCV 96.9 03/10/2020 1604   MCHC 34.7 03/10/2020 1604   RDW 11.9 03/10/2020 1604    CMP     Component Value Date/Time   NA 137 03/10/2020 1604   K 4.1 03/10/2020 1604   CL 100 03/10/2020 1604   CO2 29 03/10/2020 1604   GLUCOSE 97 03/10/2020 1604   BUN 14 03/10/2020 1604   CREATININE 1.03 03/10/2020 1604   CALCIUM 9.7 03/10/2020 1604   PROT 7.3 03/10/2020 1604   ALBUMIN 4.8 03/10/2020 1604   AST 17 03/10/2020 1604   ALT 20 03/10/2020 1604   ALKPHOS 70 03/10/2020 1604   BILITOT 0.6 03/10/2020 1604    ASSESSMENT/PLAN: 34 old male with a 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 Doreene Burke to evaluate perianal discomfort with itching, abdominal bloating, change in bowel habits and heme positive stool.  1. Change in bowel habits/perianal pain and itching/heme + stools/abd bloating --his history of perianal fistula is notable but he was not told anything specifically after having surgical therapy. It sounds as if he had a fistulotomy approximately 8 years ago. We discussed that Crohn's  disease is associated with perianal fistulas and I feel it important to exclude this diagnosis. He is also had change in bowel habits, stools heme positive primary care, and combined with bloating and hx of oral ulcers we will pursue testing as follows: --CBC, CMP, CRP and celiac panel --Fecal infectious panel and fecal leukocytes (prior diagnosis of Giardia which we will exclude with infectious panel) --If infectious panel negative proceed to colonoscopy for diagnosis. Risk benefits alternatives discussed, he is agreeable wishes to proceed  2. Intermittent GERD/nausea with rare vomiting --the symptoms may in part relate to what ever overall processes going on in #1. Could also have GERD with dyspepsia primarily. I am going to check for H. pylori and complete other evaluation as above before recommending any antacid therapy --H. pylori stool antigen, other testing as above --If celiac panel was  positive would recommend upper endoscopy to be performed on the same day as colonoscopy    LK:[id number], Talmadge Coventry, Md [address] [place],  [place] [address]

## 2020-07-22 NOTE — Patient Instructions (Signed)
If you are age 35 or older, your body mass index should be between 23-30. Your Body mass index is 26.85 kg/m. If this is out of the aforementioned range listed, please consider follow up with your Primary Care Provider.  If you are age 56 or younger, your body mass index should be between 19-25. Your Body mass index is 26.85 kg/m. If this is out of the aformentioned range listed, please consider follow up with your Primary Care Provider.   You have been scheduled for a colonoscopy. Please follow written instructions given to you at your visit today.  Please pick up your prep supplies at the pharmacy within the next 1-3 days. If you use inhalers (even only as needed), please bring them with you on the day of your procedure.  Your provider has requested that you go to the basement level for lab work before leaving today. Press "B" on the elevator. The lab is located at the first door on the left as you exit the elevator.  Due to recent changes in healthcare laws, you may see the results of your imaging and laboratory studies on MyChart before your provider has had a chance to review them.  We understand that in some cases there may be results that are confusing or concerning to you. Not all laboratory results come back in the same time frame and the provider may be waiting for multiple results in order to interpret others.  Please give [place] 48 hours in order for your provider to thoroughly review all the results before contacting the office for clarification of your results.

## 2020-07-25 LAB — GLIADIN ANTIBODIES, SERUM
Gliadin IgA: 2 U/mL
Gliadin IgG: <1 U/mL

## 2020-07-25 LAB — IGA: Immunoglobulin A: 254 mg/dL (ref 47–310)

## 2020-07-25 LAB — TISSUE TRANSGLUTAMINASE, IGA: (tTG) Ab, IgA: <1 U/mL

## 2020-07-27 ENCOUNTER — Telehealth: Payer: Self-pay

## 2020-07-27 LAB — GI PROFILE, STOOL, PCR

## 2020-07-27 NOTE — Telephone Encounter (Signed)
See result note.  

## 2020-07-27 NOTE — Telephone Encounter (Signed)
Received call report on this pt for lab result, see below.  Enteropathogenic E coli Not Detected DetectedAbnormal

## 2020-07-29 LAB — HELICOBACTER PYLORI  SPECIAL ANTIGEN
MICRO NUMBER:: 11197256
SPECIMEN QUALITY: ADEQUATE

## 2020-07-29 LAB — FECAL LEUKOCYTES
Micro Number: 11196922
Result: NOT DETECTED
Specimen Quality: ADEQUATE

## 2020-08-30 ENCOUNTER — Telehealth: Payer: Self-pay

## 2020-08-30 MED ORDER — OSELTAMIVIR PHOSPHATE 75 MG PO CAPS: 75.0000 mg | ORAL_CAPSULE | Freq: Every day | ORAL | 0 refills | Status: DC

## 2020-08-30 NOTE — Telephone Encounter (Signed)
Patient and stated that his younger has tested positive for Flu and was told by son's pediatrician to call his PCP to see if he could get Tamaflu called into his pharmacy.  I informed pt that Dr. Doreene Burke is out of office and that I will send message back to office Doc of the day.  Please advise. CB# [phone]

## 2020-09-23 ENCOUNTER — Ambulatory Visit (AMBULATORY_SURGERY_CENTER): Payer: No Typology Code available for payment source | Admitting: Internal Medicine

## 2020-09-23 ENCOUNTER — Encounter: Payer: Self-pay | Admitting: Internal Medicine

## 2020-09-23 ENCOUNTER — Other Ambulatory Visit: Payer: Self-pay

## 2020-09-23 VITALS — BP 112/70 | HR 60 | Temp 96.8°F | Resp 19 | Ht 72.0 in | Wt 198.0 lb

## 2020-09-23 DIAGNOSIS — R194 Change in bowel habit: Secondary | ICD-10-CM

## 2020-09-23 DIAGNOSIS — K602 Anal fissure, unspecified: Secondary | ICD-10-CM

## 2020-09-23 DIAGNOSIS — R195 Other fecal abnormalities: Secondary | ICD-10-CM | POA: Diagnosis not present

## 2020-09-23 DIAGNOSIS — R140 Abdominal distension (gaseous): Secondary | ICD-10-CM

## 2020-09-23 DIAGNOSIS — K648 Other hemorrhoids: Secondary | ICD-10-CM | POA: Diagnosis not present

## 2020-09-23 HISTORY — PX: COLONOSCOPY: SHX174

## 2020-09-23 MED ORDER — SODIUM CHLORIDE 0.9 % IV SOLN: 500.0000 mL | Freq: Once | INTRAVENOUS | Status: DC

## 2020-09-23 MED ORDER — DILTIAZEM GEL 2 %: CUTANEOUS | 1 refills | Status: DC

## 2020-09-23 MED ORDER — CLOTRIMAZOLE-BETAMETHASONE 1-0.05 % EX CREA: 1.0000 "application " | TOPICAL_CREAM | Freq: Two times a day (BID) | CUTANEOUS | 1 refills | Status: DC

## 2020-09-23 NOTE — Patient Instructions (Signed)
Handouts on hemorrhoids given.  YOU HAD AN ENDOSCOPIC PROCEDURE TODAY: Refer to the procedure report and other information in the discharge instructions given to you for any specific questions about what was found during the examination. If this information does not answer your questions, please call [place] office at [phone] to clarify.   YOU SHOULD EXPECT: Some feelings of bloating in the abdomen. Passage of more gas than usual. Walking can help get rid of the air that was put into your GI tract during the procedure and reduce the bloating. If you had a lower endoscopy (such as a colonoscopy or flexible sigmoidoscopy) you may notice spotting of blood in your stool or on the toilet paper. Some abdominal soreness may be present for a day or two, also.  DIET: Your first meal following the procedure should be a light meal and then it is ok to progress to your normal diet. A half-sandwich or bowl of soup is an example of a good first meal. Heavy or fried foods are harder to digest and may make you feel nauseous or bloated. Drink plenty of fluids but you should avoid alcoholic beverages for 24 hours. If you had a esophageal dilation, please see attached instructions for diet.    ACTIVITY: Your care partner should take you home directly after the procedure. You should plan to take it easy, moving slowly for the rest of the day. You can resume normal activity the day after the procedure however YOU SHOULD NOT DRIVE, use power tools, machinery or perform tasks that involve climbing or major physical exertion for 24 hours (because of the sedation medicines used during the test).   SYMPTOMS TO REPORT IMMEDIATELY: A gastroenterologist can be reached at any hour. Please call [phone]  for any of the following symptoms:  . Following lower endoscopy (colonoscopy, flexible sigmoidoscopy) Excessive amounts of blood in the stool  Significant tenderness, worsening of abdominal pains  Swelling of the abdomen  that is new, acute  Fever of 100 or higher  FOLLOW UP:  If any biopsies were taken you will be contacted by phone or by letter within the next 1-3 weeks. Call [phone]  if you have not heard about the biopsies in 3 weeks.  Please also call with any specific questions about appointments or follow up tests.

## 2020-09-23 NOTE — Progress Notes (Signed)
A and O x3. Report to RN. Tolerated MAC anesthesia well.

## 2020-09-23 NOTE — Op Note (Signed)
[facility] Patient Name: Willie Maynard Procedure Date: 09/23/2020 2:50 PM MRN: [id number] Endoscopist: Beverley FiedlerJay M Javonna Balli , MD Age: [age] Referring MD:  Date of Birth: 10/09/1985 Gender: Male Account #: [id number] Procedure:                Colonoscopy Indications:              Heme positive stool, change in bowel habits,                            perianal pain and itching, abdominal bloating Medicines:                Monitored Anesthesia Care Procedure:                Pre-Anesthesia Assessment:                           - Prior to the procedure, a History and Physical                            was performed, and patient medications and                            allergies were reviewed. The patient's tolerance of                            previous anesthesia was also reviewed. The risks                            and benefits of the procedure and the sedation                            options and risks were discussed with the patient.                            All questions were answered, and informed consent                            was obtained. Prior Anticoagulants: The patient has                            taken no previous anticoagulant or antiplatelet                            agents. ASA Grade Assessment: II - A patient with                            mild systemic disease. After reviewing the risks                            and benefits, the patient was deemed in                            satisfactory condition to undergo the procedure.  After obtaining informed consent, the colonoscope                            was passed under direct vision. Throughout the                            procedure, the patient's blood pressure, pulse, and                            oxygen saturations were monitored continuously. The                            Olympus CF-HQ190L ([id number]) Colonoscope was                            introduced through the  anus and advanced to the                            cecum, identified by transillumination. The                            colonoscopy was performed without difficulty. The                            patient tolerated the procedure well. The quality                            of the bowel preparation was good. The ileocecal                            valve, appendiceal orifice, and rectum were                            photographed. Scope In: 3:01:02 PM Scope Out: 3:13:26 PM Scope Withdrawal Time: 0 hours 9 minutes 56 seconds  Total Procedure Duration: 0 hours 12 minutes 24 seconds  Findings:                 The perianal exam findings include a perianal rash                            (with anterior minor excoriation). 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 No evidence                            for fistula today.                           The terminal ileum appeared normal.                           The colon (entire examined portion) appeared normal.  Internal hemorrhoids and hypertrophied anal                            papillae were found during retroflexion. The                            hemorrhoids were small.                           A small anal fissure was found in the anterior anal                            canal and visible with forwarding viewing                            colonoscope. Complications:            No immediate complications. Estimated Blood Loss:     Estimated blood loss: none. Impression:               - Perianal rash and minor excoriation found on                            perianal exam.                           - The examined portion of the ileum was normal.                           - The entire examined colon is normal.                           - Small internal hemorrhoids.                           - Anal fissure.                           - No specimens collected. Recommendation:            - Patient has a contact number available for                            emergencies. The signs and symptoms of potential                            delayed complications were discussed with the                            patient. Return to normal activities tomorrow.                            Written discharge instructions were provided to the                            patient.                           -  Resume previous diet.                           - Continue present medications.                           - Lotrisone cream BID to perianal skin x 10 days                            and then as needed.                           - Diltiazem gel 2% BID x 4 weeks for anal fissure.                           - RectiCare per box instruction as needed for                            pain/itch.                           - Repeat colonoscopy in 10 years for screening                            purposes. Beverley Fiedler, MD 09/23/2020 3:24:27 PM This report has been signed electronically.

## 2020-09-23 NOTE — Progress Notes (Signed)
Pt's states no medical or surgical changes since previsit or office visit.   [employer] 

## 2020-09-27 ENCOUNTER — Telehealth: Payer: Self-pay

## 2020-09-27 NOTE — Telephone Encounter (Signed)
  Follow up Call-  Call back number 09/23/2020  Post procedure Call Back phone  # [phone]  Permission to leave phone message Yes  Some recent data might be hidden     Patient questions:  Do you have a fever, pain , or abdominal swelling? No. Pain Score  0 *  Have you tolerated food without any problems? Yes.    Have you been able to return to your normal activities? Yes.    Do you have any questions about your discharge instructions: Diet   No. Medications  No. Follow up visit  No.  Do you have questions or concerns about your Care? No.  Actions: * If pain score is 4 or above: No action needed, pain <4.  1. Have you developed a fever since your procedure? no  2.   Have you had an respiratory symptoms (SOB or cough) since your procedure? no  3.   Have you tested positive for COVID 19 since your procedure no  4.   Have you had any family members/close contacts diagnosed with the COVID 19 since your procedure?  no   If yes to any of these questions please route to Laverna Peace, RN and Karlton Lemon, RN

## 2020-12-05 ENCOUNTER — Encounter: Payer: Self-pay | Admitting: Family Medicine

## 2021-08-01 ENCOUNTER — Encounter: Payer: Self-pay | Admitting: Family Medicine

## 2021-08-01 ENCOUNTER — Ambulatory Visit: Payer: No Typology Code available for payment source | Admitting: Family Medicine

## 2021-08-01 ENCOUNTER — Other Ambulatory Visit: Payer: Self-pay

## 2021-08-01 VITALS — BP 142/68 | HR 94 | Temp 98.8°F | Ht 72.0 in | Wt 200.8 lb

## 2021-08-01 DIAGNOSIS — J069 Acute upper respiratory infection, unspecified: Secondary | ICD-10-CM

## 2021-08-01 LAB — POCT INFLUENZA A/B
Influenza A, POC: NEGATIVE
Influenza B, POC: NEGATIVE

## 2021-08-01 NOTE — Patient Instructions (Signed)

## 2021-08-01 NOTE — Progress Notes (Signed)
[facility] [address] [place] [address] Dept: [phone] Dept Fax: [phone]  Office Visit  Subjective:    Patient ID: [name], DDS, male    DOB: [birth year], 35 y.o..   MRN: [id number]  Chief Complaint  Patient presents with   Acute Visit    C/o having  HA, fever, cough, fatigue, chills x 1 day.  Negative for covid.    He has taken Ibuprofen and Nyquil/Dayquil.      History of Present Illness:  Patient is in today with a 2 day history of headache, fever (up to 100.1 F), nonproductive cough, fatigue, and chills. He denies any nasal congestion or sore throat. He does not smoke cigarettes. He has been using Nyquil and Dayquil for symptoms relief. His son has also been sick at home. Mr. Keltz did a home COVID test which was negative. He has had COVID int he past. He did have 2 COVID vaccines plus 1 booster.  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   Irritable bowel syndrome 03/10/2020   Heme positive stool 03/10/2020   Healthcare maintenance 09/18/2018   Past Surgical History:  Procedure Laterality Date   ACHILLES TENDON REPAIR Right    x 3   COLONOSCOPY  09/23/2020   TONSILLECTOMY     WISDOM TOOTH EXTRACTION     Family History  Problem Relation Age of Onset   Hypertension Father    Colon polyps Father    Cancer Maternal Grandmother    Colon cancer Maternal Grandmother        diagnosed in her 46's   Cancer Paternal Grandfather    Colon polyps Mother    Esophageal cancer Neg Hx    Stomach cancer Neg Hx    Rectal cancer Neg Hx    Outpatient Medications Prior to Visit  Medication Sig Dispense Refill   Folic Acid-Cholecalciferol (CHOLECAL DF PO) Take 1 tablet by mouth daily.     Multiple Vitamin (MULTIVITAMIN) tablet Take 1 tablet by mouth daily.     Probiotic Product (PRO-BIOTIC BLEND PO) Take 1 tablet by mouth daily.      triamcinolone (KENALOG) 0.1 % paste SMARTSIG:Sparingly Topical 3 Times Daily     valACYclovir (VALTREX) 1000 MG tablet Take 1,000 mg by mouth as needed.      clotrimazole-betamethasone (LOTRISONE) cream Apply 1 application topically 2 (two) times daily. To rectum (Patient not taking: Reported on 08/01/2021) 30 g 1   diltiazem 2 % GEL Using your index finger, apply a small amount of medication inside the rectum up to your first knuckle/joint twice daily x 4 weeks. 30 g 1   No facility-administered medications prior to visit.   Allergies  Allergen Reactions   Sulfa Antibiotics Hives, Other (See Comments) and Rash      Objective:   Today's Vitals   08/01/21 1056  BP: (!) 142/68  Pulse: 94  Temp: 98.8 F (37.1 C)  TempSrc: Temporal  SpO2: 98%  Weight: 200 lb 12.8 oz (91.1 kg)  Height: 6' (1.829 m)   Body mass index is 27.23 kg/m.   General: Well developed, well nourished. No acute distress. HEENT: Normocephalic, non-traumatic. PERRL, EOMI. Conjunctiva clear. External ears normal. EAC and TMs normal   bilaterally. Nose clear without congestion or rhinorrhea. Mucous membranes moist. Oropharynx with mild   cobbelstoning. Good dentition. Neck: Supple. No lymphadenopathy. No thyromegaly. Lungs: Clear to auscultation bilaterally. No wheezing, rales  or rhonchi. CV: RRR without murmurs or rubs. Pulses 2+ bilaterally. Psych: Alert and oriented x3. Normal mood and affect.  Health Maintenance Due  Topic Date Due   Hepatitis C Screening  Never done   COVID-19 Vaccine (3 - Booster for Pfizer series) 01/09/2020   INFLUENZA VACCINE  Never done   Lab Results POCT Influenza A & B: Negative    Assessment & Plan:   1. Viral URI with cough Discussed home care for viral illness, including rest, pushing fluids, and OTC medications as needed for symptom relief. Recommend hot tea with honey for sore throat symptoms. Follow-up if needed for worsening or persistent symptoms.  - POCT Influenza  A/B - Novel Coronavirus, NAA ([facility])    Loyola Mast, MD

## 2021-08-01 NOTE — Addendum Note (Signed)
Addended by: Renaldo Reel S on: 08/01/2021 11:44 AM   Modules accepted: Orders

## 2021-08-02 LAB — SARS-COV-2, NAA 2 DAY TAT

## 2021-08-02 LAB — NOVEL CORONAVIRUS, NAA: SARS-CoV-2, NAA: NOT DETECTED

## 2022-07-06 ENCOUNTER — Encounter: Payer: Self-pay | Admitting: Family Medicine

## 2022-07-06 ENCOUNTER — Ambulatory Visit (INDEPENDENT_AMBULATORY_CARE_PROVIDER_SITE_OTHER): Payer: 59 | Admitting: Family Medicine

## 2022-07-06 VITALS — BP 122/84 | HR 70 | Temp 97.7°F | Ht 72.0 in | Wt 189.2 lb

## 2022-07-06 DIAGNOSIS — Z0000 Encounter for general adult medical examination without abnormal findings: Secondary | ICD-10-CM | POA: Diagnosis not present

## 2022-07-06 LAB — COMPREHENSIVE METABOLIC PANEL
ALT: 17 U/L (ref 0–53)
AST: 14 U/L (ref 0–37)
Albumin: 4.5 g/dL (ref 3.5–5.2)
Alkaline Phosphatase: 64 U/L (ref 39–117)
BUN: 15 mg/dL (ref 6–23)
CO2: 29 mEq/L (ref 19–32)
Calcium: 9.8 mg/dL (ref 8.4–10.5)
Chloride: 101 mEq/L (ref 96–112)
Creatinine, Ser: 1.1 mg/dL (ref 0.40–1.50)
GFR: 86.12 mL/min (ref >60.00)
Glucose, Bld: 101 mg/dL — ABNORMAL HIGH (ref 70–99)
Potassium: 4.7 mEq/L (ref 3.5–5.1)
Sodium: 138 mEq/L (ref 135–145)
Total Bilirubin: 0.7 mg/dL (ref 0.2–1.2)
Total Protein: 7 g/dL (ref 6.0–8.3)

## 2022-07-06 LAB — LIPID PANEL
Cholesterol: 201 mg/dL — ABNORMAL HIGH (ref 0–200)
HDL: 41.1 mg/dL (ref >39.00)
LDL Cholesterol: 136 mg/dL — ABNORMAL HIGH (ref 0–99)
NonHDL: 159.42
Total CHOL/HDL Ratio: 5
Triglycerides: 116 mg/dL (ref 0.0–149.0)
VLDL: 23.2 mg/dL (ref 0.0–40.0)

## 2022-07-06 LAB — URINALYSIS, ROUTINE W REFLEX MICROSCOPIC
Bilirubin Urine: NEGATIVE
Hgb urine dipstick: NEGATIVE
Ketones, ur: NEGATIVE
Leukocytes,Ua: NEGATIVE
Nitrite: NEGATIVE
Specific Gravity, Urine: 1.01 (ref 1.000–1.030)
Total Protein, Urine: NEGATIVE
Urine Glucose: NEGATIVE
Urobilinogen, UA: 0.2 (ref 0.0–1.0)
pH: 8 (ref 5.0–8.0)

## 2022-07-06 LAB — CBC
HCT: 43.9 % (ref 39.0–52.0)
Hemoglobin: 15 g/dL (ref 13.0–17.0)
MCHC: 34.2 g/dL (ref 30.0–36.0)
MCV: 94.8 fl (ref 78.0–100.0)
Platelets: 261 10*3/uL (ref 150.0–400.0)
RBC: 4.63 Mil/uL (ref 4.22–5.81)
RDW: 11.9 % (ref 11.5–15.5)
WBC: 4.6 10*3/uL (ref 4.0–10.5)

## 2022-07-06 NOTE — Progress Notes (Signed)
Established Patient Office Visit  Subjective   Patient ID: Willie Maynard, DDS, male    DOB: [birth year]  Age: 36 y.o. MRN: [id number]  Chief Complaint  Patient presents with   Annual Exam    CPE/labs. Fasting today.  No concerns.      HPI for yearly physical.  He is doing well.  He exercises regularly with regular dental care.  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  He is having no issues or concerns.    Review of Systems  Constitutional: Negative.   HENT: Negative.    Eyes:  Negative for blurred vision, discharge and redness.  Respiratory: Negative.    Cardiovascular: Negative.   Gastrointestinal:  Negative for abdominal pain.  Genitourinary: Negative.   Musculoskeletal: Negative.  Negative for myalgias.  Skin:  Negative for rash.  Neurological:  Negative for tingling, loss of consciousness and weakness.  Endo/Heme/Allergies:  Negative for polydipsia.  Psychiatric/Behavioral:  Negative for depression. The patient is not nervous/anxious and does not have insomnia.       07/06/2022    9:45 AM 07/06/2022    9:11 AM 03/10/2020    4:24 PM  Depression screen PHQ 2/9  Decreased Interest 0 0 0  Down, Depressed, Hopeless 0 0 0  PHQ - 2 Score 0 0 0  Altered sleeping 0  0  Tired, decreased energy 1  0  Change in appetite 0  0  Feeling bad or failure about yourself  0  0  Trouble concentrating 0  0  Moving slowly or fidgety/restless 0  0  Suicidal thoughts 0  0  PHQ-9 Score 1  0  Difficult doing work/chores Not difficult at all  Not difficult at all       Objective:     BP 122/84   Pulse 70   Temp 97.7 F (36.5 C) (Temporal)   Ht 6' (1.829 m)   Wt 189 lb 3.2 oz (85.8 kg)   SpO2 98%   BMI 25.66 kg/m    Physical Exam Constitutional:      General: He is not in acute distress.    Appearance: Normal appearance. He is not ill-appearing, toxic-appearing or diaphoretic.  HENT:     Head: Normocephalic and atraumatic.     Right  Ear: External ear normal.     Left Ear: External ear normal.     Mouth/Throat:     Mouth: Mucous membranes are moist.     Pharynx: Oropharynx is clear. No oropharyngeal exudate or posterior oropharyngeal erythema.  Eyes:     General: No scleral icterus.       Right eye: No discharge.        Left eye: No discharge.     Extraocular Movements: Extraocular movements intact.     Conjunctiva/sclera: Conjunctivae normal.     Pupils: Pupils are equal, round, and reactive to light.  Cardiovascular:     Rate and Rhythm: Normal rate and regular rhythm.  Pulmonary:     Effort: Pulmonary effort is normal. No respiratory distress.     Breath sounds: Normal breath sounds.  Abdominal:     General: Bowel sounds are normal.     Tenderness: There is no abdominal tenderness. There is no guarding.     Hernia: There is no hernia in the left inguinal area or right inguinal area.  Genitourinary:    Penis: Circumcised. No hypospadias, erythema, tenderness, discharge, swelling or lesions.  Testes:        Right: Mass, tenderness or swelling not present. Right testis is descended.        Left: Mass, tenderness or swelling not present. Left testis is descended.     Epididymis:     Right: Not inflamed or enlarged.     Left: Not inflamed or enlarged.  Musculoskeletal:     Cervical back: No rigidity or tenderness.  Lymphadenopathy:     Lower Body: No right inguinal adenopathy. No left inguinal adenopathy.  Skin:    General: Skin is warm and dry.  Neurological:     Mental Status: He is alert and oriented to person, place, and time.  Psychiatric:        Mood and Affect: Mood normal.        Behavior: Behavior normal.      No results found for any visits on 07/06/22.    The ASCVD Risk score (Arnett DK, et al., 2019) failed to calculate for the following reasons:   The 2019 ASCVD risk score is only valid for ages 16 to 75    Assessment & Plan:   Problem List Items Addressed This Visit        Other   Healthcare maintenance - Primary   Relevant Orders   CBC   Comprehensive metabolic panel   Lipid panel   Urinalysis, Routine w reflex microscopic    Return in about 1 year (around 07/07/2023).  Continue active healthy lifestyle.  Information given on exercising to stay healthy.  Try to exercise 150 minutes weekly.  Mliss Sax, MD

## 2022-08-22 ENCOUNTER — Encounter: Payer: Self-pay | Admitting: Family Medicine

## 2022-08-24 ENCOUNTER — Telehealth: Payer: Self-pay | Admitting: Internal Medicine

## 2022-08-24 MED ORDER — DILTIAZEM GEL 2 %: 1.0000 | Freq: Two times a day (BID) | CUTANEOUS | 0 refills | Status: AC

## 2022-08-24 MED ORDER — CLOTRIMAZOLE-BETAMETHASONE 1-0.05 % EX CREA: 1.0000 | TOPICAL_CREAM | Freq: Two times a day (BID) | CUTANEOUS | 0 refills | Status: AC

## 2022-08-24 NOTE — Telephone Encounter (Signed)
Ok to refill both meds 

## 2022-08-24 NOTE — Telephone Encounter (Signed)
Refills called to [facility] and pt aware.

## 2022-08-24 NOTE — Telephone Encounter (Signed)
Patient is calling states he is having the same irritation he had from his last appointment and is wondering if he can get a refill on the ointment that was prescribed to him. Please advise

## 2022-08-24 NOTE — Telephone Encounter (Signed)
Pt states he is having the perianal itching again and thinks he has a fissure again. He is calling requesting a refill on the Lotrisone cream and diltiazem gel. Reports he has been using the Calmoseptine for a few weeks and it has not helped. Please advise.

## 2022-09-25 ENCOUNTER — Ambulatory Visit: Payer: 59 | Admitting: Nurse Practitioner

## 2022-09-25 ENCOUNTER — Encounter: Payer: Self-pay | Admitting: Nurse Practitioner

## 2022-09-25 VITALS — BP 118/80 | HR 97 | Temp 97.5°F | Ht 72.0 in | Wt 196.0 lb

## 2022-09-25 DIAGNOSIS — J029 Acute pharyngitis, unspecified: Secondary | ICD-10-CM | POA: Diagnosis not present

## 2022-09-25 DIAGNOSIS — J020 Streptococcal pharyngitis: Secondary | ICD-10-CM | POA: Diagnosis not present

## 2022-09-25 LAB — POCT INFLUENZA A/B
Influenza A, POC: NEGATIVE
Influenza B, POC: NEGATIVE

## 2022-09-25 LAB — POCT RAPID STREP A (OFFICE): Rapid Strep A Screen: POSITIVE — AB

## 2022-09-25 MED ORDER — AMOXICILLIN 500 MG PO CAPS: 500.0000 mg | ORAL_CAPSULE | Freq: Two times a day (BID) | ORAL | 0 refills | Status: AC

## 2022-09-25 MED ORDER — NYSTATIN 100000 UNIT/ML MT SUSP: 5.0000 mL | Freq: Four times a day (QID) | OROMUCOSAL | 0 refills | Status: DC | PRN

## 2022-09-25 NOTE — Progress Notes (Signed)
Acute Office Visit  Subjective:     Patient ID: Willie Maynard, DDS, male    DOB: 08-22-1986, 37 y.o.   MRN: [id number]  Chief Complaint  Patient presents with   Acute Visit    Sore  throat  feeling tried and weakness  having low grade fever, taking otc , chills ,  took a home covid and it was neg     HPI Patient is in today for sore throat, headache, and bodyaches that started Sunday.   UPPER RESPIRATORY TRACT INFECTION  Fever: yes Cough: no Shortness of breath: no Wheezing: no Chest pain: no Chest tightness: no Chest congestion: no Nasal congestion: no Runny nose: no Post nasal drip: no Sneezing: no Sore throat: yes Swollen glands: yes Sinus pressure: no Headache: yes Face pain: no Toothache: no Ear pain: no bilateral Ear pressure: no bilateral Eyes red/itching:no Eye drainage/crusting: no  Vomiting: no Rash: no Fatigue: yes Sick contacts: no Strep contacts: no  Context: worse Recurrent sinusitis: no Relief with OTC cold/cough medications:  helps with headache, not throat   Treatments attempted: ibuprofen    ROS See pertinent positives and negatives per HPI.     Objective:    BP 118/80   Pulse 97   Temp (!) 97.5 F (36.4 C)   Ht 6' (1.829 m)   Wt 196 lb (88.9 kg)   SpO2 97%   BMI 26.58 kg/m    Physical Exam Vitals and nursing note reviewed.  Constitutional:      Appearance: Normal appearance.  HENT:     Head: Normocephalic.     Right Ear: Tympanic membrane, ear canal and external ear normal.     Left Ear: Tympanic membrane, ear canal and external ear normal.     Mouth/Throat:     Pharynx: Posterior oropharyngeal erythema present. No oropharyngeal exudate.  Eyes:     Conjunctiva/sclera: Conjunctivae normal.  Cardiovascular:     Rate and Rhythm: Normal rate and regular rhythm.     Pulses: Normal pulses.     Heart sounds: Normal heart sounds.  Pulmonary:     Effort: Pulmonary effort is normal.     Breath sounds: Normal breath  sounds.  Musculoskeletal:     Cervical back: Normal range of motion and neck supple. Tenderness present.  Lymphadenopathy:     Cervical: Cervical adenopathy present.  Skin:    General: Skin is warm.  Neurological:     General: No focal deficit present.     Mental Status: He is alert and oriented to person, place, and time.  Psychiatric:        Mood and Affect: Mood normal.        Behavior: Behavior normal.        Thought Content: Thought content normal.        Judgment: Judgment normal.     Results for orders placed or performed in visit on 09/25/22  POCT rapid strep A  Result Value Ref Range   Rapid Strep A Screen Positive (A) Negative  POCT Influenza A/B  Result Value Ref Range   Influenza A, POC Negative Negative   Influenza B, POC Negative Negative        Assessment & Plan:   Problem List Items Addressed This Visit   None Visit Diagnoses     Strep pharyngitis    -  Primary   Start amoxicillin 500mg  BID. Encourage fluids, rest. Discussed contagious x24 hours. F/U if not improving.   Relevant Medications  magic mouthwash (nystatin, lidocaine, diphenhydrAMINE, alum & mag hydroxide) suspension   Sore throat       strep A positive in office. Can use ibuprofen and gargle with warm salt water for pain. Will also give magic mouthwash QID prn pain.   Relevant Orders   POCT rapid strep A (Completed)   POCT Influenza A/B (Completed)       Meds ordered this encounter  Medications   amoxicillin (AMOXIL) 500 MG capsule    Sig: Take 1 capsule (500 mg total) by mouth 2 (two) times daily for 10 days.    Dispense:  20 capsule    Refill:  0   magic mouthwash (nystatin, lidocaine, diphenhydrAMINE, alum & mag hydroxide) suspension    Sig: Swish and swallow 5 mLs 4 (four) times daily as needed for mouth pain.    Dispense:  180 mL    Refill:  0    Return if symptoms worsen or fail to improve.  Charyl Dancer, NP

## 2022-09-25 NOTE — Patient Instructions (Signed)
It was great to see you!  Start amoxicillin twice a day for 10 days.   You can take magic mouthwash 4 times a day as needed for sore throat (gargle and swallow)  You can also gargle with warm salt water and continue ibuprofen.   Let's follow-up if your symptoms worsen or don't improve.   Take care,  Vance Peper, NP

## 2023-10-18 ENCOUNTER — Ambulatory Visit: Payer: 59 | Admitting: Nurse Practitioner

## 2023-12-06 ENCOUNTER — Encounter: Payer: Self-pay | Admitting: Nurse Practitioner

## 2023-12-06 ENCOUNTER — Ambulatory Visit: Payer: 59 | Admitting: Nurse Practitioner

## 2023-12-06 VITALS — BP 104/70 | HR 100 | Ht 72.0 in | Wt 199.0 lb

## 2023-12-06 DIAGNOSIS — Z83719 Family history of colon polyps, unspecified: Secondary | ICD-10-CM | POA: Diagnosis not present

## 2023-12-06 DIAGNOSIS — Z800 Family history of malignant neoplasm of digestive organs: Secondary | ICD-10-CM | POA: Diagnosis not present

## 2023-12-06 DIAGNOSIS — L290 Pruritus ani: Secondary | ICD-10-CM | POA: Diagnosis not present

## 2023-12-06 NOTE — Patient Instructions (Signed)
 Contact our office if anal irritation recurs.  Follow up as needed.  Due to recent changes in healthcare laws, you may see the results of your imaging and laboratory studies on MyChart before your provider has had a chance to review them.  We understand that in some cases there may be results that are confusing or concerning to you. Not all laboratory results come back in the same time frame and the provider may be waiting for multiple results in order to interpret others.  Please give [place] 48 hours in order for your provider to thoroughly review all the results before contacting the office for clarification of your results.   Thank you for trusting me with your gastrointestinal care!   Alcide Evener, CRNP

## 2023-12-06 NOTE — Progress Notes (Signed)
 12/06/2023 [name], DDS [id number] 06-Feb-1986   Chief Complaint: Anal irritation   History of Present Illness: Miqueas Whilden. 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 He is known by Dr. Rhea Belton. He presents today for further evaluation regarding intermittent anal irritation and bouts of a small bump to the external left anal area which occurs once every 4 to 6 month, last occurred 3 months ago and typically lasts for a few weeks and is typically noticed when wiping a lot. He applies a small amount of Lotrisone for a few days and his anal irritation abates. He infrequently sees a small amount of blood on the toilet tissue. He hasn't used Diltiazem fissure ointment for the past year.  He had a bidet installed in his home which has provided significant improvement regarding his anal symptoms.  He typically passes a normal formed stool daily.  He takes a probiotic for the past 5 years.  No abdominal pain.  He underwent a colonoscopy 09/23/2020 which showed minor.  No excoriation and scarring from prior anterior anal fistulotomy and small internal hemorrhoids otherwise was normal.  He was advised to repeat a colonoscopy in 10 years.  Father with history of colon polyps.  His grandmother was diagnosed with colon cancer in her 30s.  No known family history of IBD.     Latest Ref Rng & Units 07/06/2022    9:53 AM 07/22/2020   10:42 AM 03/10/2020    4:04 PM  CBC  WBC 4.0 - 10.5 K/uL 4.6  4.5  6.1   Hemoglobin 13.0 - 17.0 g/dL 40.9  81.1  91.4   Hematocrit 39.0 - 52.0 % 43.9  44.0  42.8   Platelets 150.0 - 400.0 K/uL 261.0  246.0  257.0         Latest Ref Rng & Units 07/06/2022    9:53 AM 07/22/2020   10:42 AM 03/10/2020    4:04 PM  CMP  Glucose 70 - 99 mg/dL 782  93  97   BUN 6 - 23 mg/dL 15  13  14    Creatinine 0.40 - 1.50 mg/dL 9.56  2.13  0.86   Sodium 135 - 145 mEq/L  138  137  137   Potassium 3.5 - 5.1 mEq/L 4.7  4.5  4.1   Chloride 96 - 112 mEq/L 101  101  100   CO2 19 - 32 mEq/L 29  31  29    Calcium 8.4 - 10.5 mg/dL 9.8  9.6  9.7   Total Protein 6.0 - 8.3 g/dL 7.0  7.6  7.3   Total Bilirubin 0.2 - 1.2 mg/dL 0.7  1.0  0.6   Alkaline Phos 39 - 117 U/L 64  61  70   AST 0 - 37 U/L 14  21  17    ALT 0 - 53 U/L 17  29  20       Colonoscopy 09/23/2020 by Dr. Rhea Belton: - Perianal rash and minor excoriation found on perianal exam.  - The perianal exam findings include a perianal rash. Minor scarring anterior anus from prior fistulotomy. No evidence for fistula today. - The examined portion of the ileum was normal.  -The entire examined colon is normal.  - Small internal hemorrhoids. -Recall colonoscopy 10 years  Current Outpatient Medications on File Prior to Visit  Medication Sig Dispense Refill   Folic Acid-Cholecalciferol (CHOLECAL DF  PO) Take 1 tablet by mouth daily.     magic mouthwash (nystatin, lidocaine, diphenhydrAMINE, alum & mag hydroxide) suspension Swish and swallow 5 mLs 4 (four) times daily as needed for mouth pain. 180 mL 0   Multiple Vitamin (MULTIVITAMIN) tablet Take 1 tablet by mouth daily.     Probiotic Product (PRO-BIOTIC BLEND PO) Take 1 tablet by mouth daily.     clotrimazole-betamethasone (LOTRISONE) cream Apply 1 Application topically 2 (two) times daily. (Patient not taking: Reported on 12/06/2023) 30 g 0   diltiazem 2 % GEL Apply 1 Application topically 2 (two) times daily. (Patient not taking: Reported on 12/06/2023) 30 g 0   No current facility-administered medications on file prior to visit.   Allergies  Allergen Reactions   Sulfa Antibiotics Hives, Other (See Comments) and Rash    Current Medications, Allergies, Past Medical History, Past Surgical History, Family History and Social History were reviewed in [employer] record.  Review of Systems:   Constitutional: Negative for fever, sweats, chills or  weight loss.  Respiratory: Negative for shortness of breath.   Cardiovascular: Negative for chest pain, palpitations and leg swelling.  Gastrointestinal: See HPI.  Musculoskeletal: Negative for back pain or muscle aches.  Neurological: Negative for dizziness, headaches or paresthesias.   Physical Exam: BP 104/70   Pulse 100   Ht 6' (1.829 m)   Wt 199 lb (90.3 kg)   BMI 26.99 kg/m   General: 38 year old male in no acute distress. Head: Normocephalic and atraumatic. Eyes: No scleral icterus. Conjunctiva pink . Ears: Normal auditory acuity. Mouth: Dentition intact. No ulcers or lesions.  Lungs: Clear throughout to auscultation. Heart: Regular rate and rhythm, no murmur. Abdomen: Soft, nontender and nondistended. No masses or hepatomegaly. Normal bowel sounds x 4 quadrants.  Rectal: No external hemorrhoids. Small thin line of hemorrhoidal tissue to the left anal opening without lesion or edema.  Scarring with sandpaper texture noted to the anterior anal area likely scarring from prior fistulotomy. CMA Shell present during exam. Musculoskeletal: Symmetrical with no gross deformities. Extremities: No edema. Neurological: Alert oriented x 4. No focal deficits.  Psychological: Alert and cooperative. Normal mood and affect  Assessment and Recommendations:  38 year old male with a history of a perianal fistula s/p fistulotomy 2013 with intermittent anal irritation and a recurrent small bump to the left anal area, las episode was 3 months ago.  His anal irritation symptoms significantly improved after he utilized a bidet and avoided aggressive wiping. -His anorectal exam today was unrevealing as he has been asymptomatic for the past 3 months.  I advised the patient to call our office the next time he has a flare of anal irritation or feels a bump to the anal area and I will facilitate an office visit to repeat a rectal exam while having active symptoms -Recommended using Dove unscented soap  with bathing -Okay to use Lotrisone apply a small amount once or twice daily as needed, not to use more than 7 consecutive days  Colon cancer screening.  Colonoscopy 09/2020 did not identify any colon polyps.  Father with history of colon polyps.  Grandmother with colon cancer diagnosed in her 14s. -Next screening colonoscopy due 09/2030, earlier if symptoms warrant a diagnostic colonoscopy

## 2023-12-19 NOTE — Progress Notes (Signed)
 Addendum: Reviewed and agree with assessment and management plan. Asha Grumbine, Carie Caddy, MD

## 2024-07-24 ENCOUNTER — Ambulatory Visit

## 2024-07-24 DIAGNOSIS — L989 Disorder of the skin and subcutaneous tissue, unspecified: Secondary | ICD-10-CM | POA: Diagnosis not present

## 2024-07-24 DIAGNOSIS — D229 Melanocytic nevi, unspecified: Secondary | ICD-10-CM

## 2024-07-24 NOTE — Progress Notes (Signed)
 NAME: Willie Maynard, DDS  MRN: [id number]  DOB: [birth year]   Referring physician: Berneta Elsie Maynard,*  PCP: Willie Elsie Sayre, MD   CHIEF COMPLAINT: Mole of the left cheek    HPI:  Willie Maynard, DDS is a 38 y.o. year old male who presents with a left cheek mole that is non painful, soft and has enlarged over the last years slowly. It bleeds when patient cleans his face. No episodes of infection noted.     PMH: Past Medical History:  Diagnosis Date   Ankle abscess    Deviated nasal septum    GERD (gastroesophageal reflux disease)    History of Epstein-Barr virus infection    Irritable bowel syndrome    Pyogenic arthritis (HCC)    Rupture of right Achilles tendon    PSH:  Past Surgical History:  Procedure Laterality Date   ACHILLES TENDON REPAIR Right    x 3   COLONOSCOPY  09/23/2020   TONSILLECTOMY     WISDOM TOOTH EXTRACTION       MEDICATIONS:   Current Outpatient Medications:    Multiple Vitamin (MULTIVITAMIN) tablet, Take 1 tablet by mouth daily., Disp: , Rfl:    Probiotic Product (PRO-BIOTIC BLEND PO), Take 1 tablet by mouth daily., Disp: , Rfl:    clotrimazole -betamethasone  (LOTRISONE ) cream, Apply 1 Application topically 2 (two) times daily. (Patient not taking: Reported on 07/24/2024), Disp: 30 g, Rfl: 0   diltiazem  2 % GEL, Apply 1 Application topically 2 (two) times daily. (Patient not taking: Reported on 07/24/2024), Disp: 30 g, Rfl: 0   ALLERGIES:  is allergic to sulfa antibiotics.   FAMILY HISTORY:  Family History  Problem Relation Age of Onset   Hypertension Father    Colon polyps Father    Cancer Maternal Grandmother    Colon cancer Maternal Grandmother        diagnosed in her 52's   Cancer Paternal Grandfather    Colon polyps Mother    Esophageal cancer Neg Hx    Stomach cancer Neg Hx    Rectal cancer Neg Hx       VITALS:  There were no vitals filed for this visit.  Constitutional: Good color, good  hydration. VSS. Head and Neck: No lymphadenopathy, thyromegaly or masses  Chest: Normal breathing, Normal shape and excursion.  Face  Mole on left cheek 0.3 cm x 0.2 cm.  Mobile and not attached to underlying structures.  No basin lymphadenopathy, satellite lesions or in-transit lesions.    ASSESSMENT/PLAN  Assessment & Plan   Pt. presents with a lesion most representative of face mole  Today we discussed the risks, benefits and alternatives to mole excision. We discussed the alternatives which include continued observation; however, I told the patient that I do not believe this mole will resolve on its own. We then discussed the benefits of surgical excision which include complete removal of the lesion. We discussed the risks of excision which include seroma, hematoma, infection, bleeding, damage to surrounding healthy tissue, damage to surrounding structures and need for further surgery. We also discussed the risks of hair loss, wound separation and recurrence of the mole. We discussed scar patterns.  I explained that the mole will be sent to pathology and if it were to be malignant further surgery may be needed. We discussed the risks of anesthesia. The patient has a good understanding of all the risks and benefits, postoperative course and care. We obtained pictures. All questions were answered.    Recommend  excision in the treatment room. Risks, benefits and limitations were discussed. Procedure will be precertified and scheduled.  Cole Klugh M. Lyniah Fujita, MD [facility]

## 2024-09-25 ENCOUNTER — Ambulatory Visit

## 2024-09-25 ENCOUNTER — Institutional Professional Consult (permissible substitution): Admitting: Plastic Surgery

## 2024-09-25 ENCOUNTER — Other Ambulatory Visit (HOSPITAL_COMMUNITY): Admission: RE | Admit: 2024-09-25 | Discharge: 2024-09-25 | Disposition: A | Source: Ambulatory Visit

## 2024-09-25 VITALS — BP 108/70 | HR 97

## 2024-09-25 DIAGNOSIS — D2239 Melanocytic nevi of other parts of face: Secondary | ICD-10-CM | POA: Diagnosis not present

## 2024-09-25 DIAGNOSIS — D229 Melanocytic nevi, unspecified: Secondary | ICD-10-CM | POA: Insufficient documentation

## 2024-09-25 NOTE — Progress Notes (Signed)
 PROCEDURE NOTE PLASTIC SURGERY PROCEDURE NOTE   Procedure: Excision of left face mole   Diagnosis: Mole of left face   Surgeon: Nieves HERO. Jilliana Burkes, MD   Anesthesia: 1% lidocaine with epinephrine 1:100,000 Mixed with 0.25% Marcaine 50/50 dilution (total  2 mL)   Complications: None   Specimen: Multiple, sent to phatology    DESCRIPTION OF THE PROCEDURE IN DETAIL:   We discussed the risks benefits and alternatives to lesion excision. We discussed the alternatives which would be observation of the lesion. We discussed of the risks of the lesion then becoming larger or becoming infected, complicating any future excision. We discussed the benefits of excision which include removal of the lesion and the ability to examine it under a microscope to rule out the presence of a malignancy. We also discussed the benefits of removing the lesion from a symptomatic perspective. We discussed the risks of surgery, including but not limited to infection, bleeding, damage to the surrounding healthy tissues and the need for further surgery. We also discussed the risks of wound separation, poor scarring and lesion recurrence. We also discussed the risks of anesthesia. The patient again voiced their understanding of the above and once again confirmed informed consent.   The patient was identified by name, date of birth and medical record number. The patient's left face was prepped and draped in the standard sterile fashion using betadine solution. Timeout was conducted and preoperative instrument and needle counts were correct.    Local anesthesia was injected. The incision was then made around the face mole. The mole was circumferentially dissected. The size of the mole was 0.3 cm x 0.2 cm. The mole was removed and sent as a pathological specimen. The wound was irrigated with hydrogen peroxide and saline, hemostasis was achieved with Bovie electrocautery.  The residual centimeter wound was closed in layers using  4-0 Monocryl and 5-0 Nylon. The incision was dressed with bacitracin.    The patient tolerated the procedure well. There were no immediate complications. Postoperative instrument and needle counts were correct.    Postoperative care discussed, return to clinic in 7 days.   Hisako Bugh M. Mandy Fitzwater, MD [facility]

## 2024-09-30 LAB — SURGICAL PATHOLOGY

## 2024-10-02 ENCOUNTER — Ambulatory Visit

## 2024-10-02 VITALS — BP 94/63 | HR 77

## 2024-10-02 DIAGNOSIS — D229 Melanocytic nevi, unspecified: Secondary | ICD-10-CM

## 2024-10-02 NOTE — Progress Notes (Signed)
" ° °  Established Patient Office Visit  Subjective   Patient ID: Willie Maynard, DDS, male    DOB: 12/08/1985  Age: 39 y.o. MRN: [id number]  Chief Complaint  Patient presents with   Follow-up    HPI Patient is a 39 year old male who underwent an excision of a benign mole of the left cheek 7 days ago.  Has been doing well has been keeping the wound covered until today.  He has been applying bacitracin to the wound. No issues reported.  No signs of infection.    Objective:     BP 94/63 (BP Location: Right Arm, Patient Position: Sitting, Cuff Size: Large)   Pulse 77   SpO2 99%  BP Readings from Last 3 Encounters:  10/02/24 94/63  09/25/24 108/70  12/06/23 104/70      Physical Exam  MA as chaperone  Left face incision clean and dry with stitches in place.  Mild discoloration of the borders of the scar.  The ASCVD Risk score (Arnett DK, et al., 2019) failed to calculate for the following reasons:   The 2019 ASCVD risk score is only valid for ages 72 to 11  FINAL MICROSCOPIC DIAGNOSIS:   A.   SKIN, LEFT CHEEK, BIOPSY: INTRADERMAL NEVUS.   MICROSCOPIC DESCRIPTION: There is a proliferation of banal melanocytes  predominantly in the dermis. There is no atypia. The lesion extends to  the peripheral and deep margins of the specimen.     Assessment & Plan:   Problem List Items Addressed This Visit   None Visit Diagnoses       Benign mole    -  Primary      Patient doing well after mole excision of left face.  The pathology showed a benign intradermal nevus, with no atypia.  We discussed that the specimen showed that the lesion extends to the peripheral margins and the margins, yet I explained to the patient that I felt that I remove with all and since if this is a benign mole I do not recommend re-excision but I do recommend that we do surveillance.  Patient will follow-up with me in 1 month or earlier if any issues.  Patient was allowed to ask questions.  All questions  answered to patient's satisfaction.  Leavy Heatherly M Tae Robak, MD  "

## 2024-11-06 ENCOUNTER — Ambulatory Visit
